# Patient Record
Sex: Female | Born: 1975 | Race: White | Hispanic: No | Marital: Married | Smoking: Former smoker
Health system: Southern US, Community
[De-identification: ages and names within clinical notes are randomized; demographics above are authoritative.]

---

## 2009-09-06 ENCOUNTER — Encounter: Admission: RE | Admit: 2009-09-06 | Discharge: 2009-09-06 | Payer: Self-pay | Admitting: Obstetrics & Gynecology

## 2009-09-19 ENCOUNTER — Encounter: Admission: RE | Admit: 2009-09-19 | Discharge: 2009-09-19 | Payer: Self-pay | Admitting: Obstetrics & Gynecology

## 2010-09-18 ENCOUNTER — Other Ambulatory Visit: Payer: Self-pay | Admitting: Obstetrics & Gynecology

## 2010-09-18 DIAGNOSIS — Z1231 Encounter for screening mammogram for malignant neoplasm of breast: Secondary | ICD-10-CM

## 2010-10-06 ENCOUNTER — Ambulatory Visit
Admission: RE | Admit: 2010-10-06 | Discharge: 2010-10-06 | Disposition: A | Discharge status: Home / Self Care | Payer: 59 | Source: Ambulatory Visit | Attending: Obstetrics & Gynecology | Admitting: Obstetrics & Gynecology

## 2010-10-06 DIAGNOSIS — Z1231 Encounter for screening mammogram for malignant neoplasm of breast: Secondary | ICD-10-CM

## 2013-08-10 ENCOUNTER — Other Ambulatory Visit: Payer: Self-pay

## 2013-08-12 ENCOUNTER — Other Ambulatory Visit: Payer: Self-pay

## 2013-08-12 DIAGNOSIS — Z1231 Encounter for screening mammogram for malignant neoplasm of breast: Secondary | ICD-10-CM

## 2013-11-16 ENCOUNTER — Ambulatory Visit
Admission: RE | Admit: 2013-11-16 | Discharge: 2013-11-16 | Disposition: A | Discharge status: Home / Self Care | Payer: 59 | Source: Ambulatory Visit

## 2013-11-16 DIAGNOSIS — Z1231 Encounter for screening mammogram for malignant neoplasm of breast: Secondary | ICD-10-CM

## 2014-11-11 ENCOUNTER — Ambulatory Visit (INDEPENDENT_AMBULATORY_CARE_PROVIDER_SITE_OTHER): Payer: Commercial Managed Care - HMO

## 2014-11-11 DIAGNOSIS — J309 Allergic rhinitis, unspecified: Secondary | ICD-10-CM

## 2014-12-10 ENCOUNTER — Ambulatory Visit (INDEPENDENT_AMBULATORY_CARE_PROVIDER_SITE_OTHER): Payer: Commercial Managed Care - HMO

## 2014-12-10 ENCOUNTER — Other Ambulatory Visit: Payer: Self-pay

## 2014-12-10 DIAGNOSIS — J309 Allergic rhinitis, unspecified: Secondary | ICD-10-CM | POA: Diagnosis not present

## 2014-12-10 MED ORDER — EPINEPHRINE 0.3 MG/0.3ML IJ SOAJ: INTRAMUSCULAR | Status: AC

## 2014-12-10 MED ORDER — EPINEPHRINE 0.3 MG/0.3ML IJ SOAJ: INTRAMUSCULAR | Status: DC

## 2015-01-06 ENCOUNTER — Ambulatory Visit (INDEPENDENT_AMBULATORY_CARE_PROVIDER_SITE_OTHER): Payer: Commercial Managed Care - HMO

## 2015-01-06 DIAGNOSIS — J309 Allergic rhinitis, unspecified: Secondary | ICD-10-CM | POA: Diagnosis not present

## 2016-02-20 NOTE — Addendum Note (Signed)
Addended by: Berna BueWHITAKER, CARRIE L on: 02/20/2016 11:13 AM   Modules accepted: Orders

## 2017-03-08 ENCOUNTER — Other Ambulatory Visit: Payer: Self-pay | Admitting: Obstetrics & Gynecology

## 2017-03-08 DIAGNOSIS — Z1231 Encounter for screening mammogram for malignant neoplasm of breast: Secondary | ICD-10-CM

## 2017-04-24 ENCOUNTER — Encounter: Payer: Commercial Managed Care - HMO | Admitting: Obstetrics & Gynecology

## 2017-05-03 ENCOUNTER — Encounter: Payer: Self-pay | Admitting: Obstetrics & Gynecology

## 2017-05-03 ENCOUNTER — Ambulatory Visit (INDEPENDENT_AMBULATORY_CARE_PROVIDER_SITE_OTHER): Payer: 59 | Admitting: Obstetrics & Gynecology

## 2017-05-03 VITALS — BP 116/74 | Ht 64.0 in | Wt 124.0 lb

## 2017-05-03 DIAGNOSIS — Z01419 Encounter for gynecological examination (general) (routine) without abnormal findings: Secondary | ICD-10-CM

## 2017-05-03 DIAGNOSIS — Z3041 Encounter for surveillance of contraceptive pills: Secondary | ICD-10-CM | POA: Diagnosis not present

## 2017-05-03 MED ORDER — NORETHIN ACE-ETH ESTRAD-FE 1-20 MG-MCG PO TABS: 1.0000 | ORAL_TABLET | Freq: Every day | ORAL | 4 refills | Status: DC

## 2017-05-03 NOTE — Progress Notes (Signed)
Martha Lewis Jun 09, 1975 [id number]   History:    42 y.o. G0 Married  RP:  Established patient presenting for annual gyn exam   HPI: Well on Junel Fe 1/20.  No pelvic pain.  Normal vaginal secretions.  No pain with intercourse.  Urine and bowel movements normal.  Breasts normal.  BMI 21.28.  Very fit, doing [employer], wt lifting and Aerobic.  Strong Fam h/o CardioVascular disease.  Mother with Breast Ca. Screening mammogram scheduled for next week.  Health labs with Dr. Timothy Lasso.  Past medical history,surgical history, family history and social history were all reviewed and documented in the EPIC chart.  Gynecologic History Patient's last menstrual period was 11/03/2015 (lmp unknown). Contraception: OCP (estrogen/progesterone) Last Pap: 02/2016. Results were: Negative/HPV HR negative Last mammogram: 02/2016. Results were: negative.  Scheduled for Screening Mammo next week at [facility]. Bone Density: Never Colonoscopy: Never  Obstetric History OB History  Gravida Para Term Preterm AB Living  0 0 0 0 0 0  SAB TAB Ectopic Multiple Live Births  0 0 0 0 0     ROS: A ROS was performed and pertinent positives and negatives are included in the history.  GENERAL: No fevers or chills. HEENT: No change in vision, no earache, sore throat or sinus congestion. NECK: No pain or stiffness. CARDIOVASCULAR: No chest pain or pressure. No palpitations. PULMONARY: No shortness of breath, cough or wheeze. GASTROINTESTINAL: No abdominal pain, nausea, vomiting or diarrhea, melena or bright red blood per rectum. GENITOURINARY: No urinary frequency, urgency, hesitancy or dysuria. MUSCULOSKELETAL: No joint or muscle pain, no back pain, no recent trauma. DERMATOLOGIC: No rash, no itching, no lesions. ENDOCRINE: No polyuria, polydipsia, no heat or cold intolerance. No recent change in weight. HEMATOLOGICAL: No anemia or easy bruising or bleeding. NEUROLOGIC: No headache, seizures, numbness, tingling or  weakness. PSYCHIATRIC: No depression, no loss of interest in normal activity or change in sleep pattern.     Exam:   BP 116/74   Ht 5\' 4"  (1.626 m)   Wt 124 lb (56.2 kg)   LMP 11/03/2015 (LMP Unknown)   BMI 21.28 kg/m   Body mass index is 21.28 kg/m.  General appearance : Well developed well nourished female. No acute distress HEENT: Eyes: no retinal hemorrhage or exudates,  Neck supple, trachea midline, no carotid bruits, no thyroidmegaly Lungs: Clear to auscultation, no rhonchi or wheezes, or rib retractions  Heart: Regular rate and rhythm, no murmurs or gallops Breast:Examined in sitting and supine position were symmetrical in appearance, no palpable masses or tenderness,  no skin retraction, no nipple inversion, no nipple discharge, no skin discoloration, no axillary or supraclavicular lymphadenopathy Abdomen: no palpable masses or tenderness, no rebound or guarding Extremities: no edema or skin discoloration or tenderness  Pelvic: Vulva: Normal             Vagina: No gross lesions or discharge  Cervix: No gross lesions or discharge  Uterus  AV, normal size, shape and consistency, non-tender and mobile  Adnexa  Without masses or tenderness  Anus: Normal   Assessment/Plan:  42 y.o. female for annual exam   1. Well female exam with routine gynecological exam Normal gynecologic exam.  Last Pap test negative with negative high risk HPV in February 2018.  Will repeat Pap test next year.  Breast exam normal.  Screening mammogram next week.  Health labs with Dr. Timothy Lasso.  2. Encounter for surveillance of contraceptive pills Well on Junel FE 1/20.  No  contraindication to birth control pill.  Same birth control pill represcribed.  Other orders - norethindrone-ethinyl estradiol (JUNEL FE,GILDESS FE,LOESTRIN FE) 1-20 MG-MCG tablet; Take 1 tablet by mouth daily.  Genia DelMarie-Lyne Abagael Kramm MD, 3:52 PM 05/03/2017

## 2017-05-05 ENCOUNTER — Encounter: Payer: Self-pay | Admitting: Obstetrics & Gynecology

## 2017-05-05 NOTE — Patient Instructions (Signed)
1. Well female exam with routine gynecological exam Normal gynecologic exam.  Last Pap test negative with negative high risk HPV in February 2018.  Will repeat Pap test next year.  Breast exam normal.  Screening mammogram next week.  Health labs with Dr. Virgina Jock.  2. Encounter for surveillance of contraceptive pills Well on Junel FE 1/20.  No contraindication to birth control pill.  Same birth control pill represcribed.  Other orders - norethindrone-ethinyl estradiol (JUNEL FE,GILDESS FE,LOESTRIN FE) 1-20 MG-MCG tablet; Take 1 tablet by mouth daily.  Martha Lewis, it was a pleasure seeing you today!  Health Maintenance, Female Adopting a healthy lifestyle and getting preventive care can go a long way to promote health and wellness. Talk with your health care provider about what schedule of regular examinations is right for you. This is a good chance for you to check in with your provider about disease prevention and staying healthy. In between checkups, there are plenty of things you can do on your own. Experts have done a lot of research about which lifestyle changes and preventive measures are most likely to keep you healthy. Ask your health care provider for more information. Weight and diet Eat a healthy diet  Be sure to include plenty of vegetables, fruits, low-fat dairy products, and lean protein.  Do not eat a lot of foods high in solid fats, added sugars, or salt.  Get regular exercise. This is one of the most important things you can do for your health. ? Most adults should exercise for at least 150 minutes each week. The exercise should increase your heart rate and make you sweat (moderate-intensity exercise). ? Most adults should also do strengthening exercises at least twice a week. This is in addition to the moderate-intensity exercise.  Maintain a healthy weight  Body mass index (BMI) is a measurement that can be used to identify possible weight problems. It estimates body fat based  on height and weight. Your health care provider can help determine your BMI and help you achieve or maintain a healthy weight.  For females 44 years of age and older: ? A BMI below 18.5 is considered underweight. ? A BMI of 18.5 to 24.9 is normal. ? A BMI of 25 to 29.9 is considered overweight. ? A BMI of 30 and above is considered obese.  Watch levels of cholesterol and blood lipids  You should start having your blood tested for lipids and cholesterol at 45 years of age, then have this test every 5 years.  You may need to have your cholesterol levels checked more often if: ? Your lipid or cholesterol levels are high. ? You are older than 44 years of age. ? You are at high risk for heart disease.  Cancer screening Lung Cancer  Lung cancer screening is recommended for adults 58-46 years old who are at high risk for lung cancer because of a history of smoking.  A yearly low-dose CT scan of the lungs is recommended for people who: ? Currently smoke. ? Have quit within the past 15 years. ? Have at least a 30-pack-year history of smoking. A pack year is smoking an average of one pack of cigarettes a day for 1 year.  Yearly screening should continue until it has been 15 years since you quit.  Yearly screening should stop if you develop a health problem that would prevent you from having lung cancer treatment.  Breast Cancer  Practice breast self-awareness. This means understanding how your breasts normally appear and  feel.  It also means doing regular breast self-exams. Let your health care provider know about any changes, no matter how small.  If you are in your 20s or 30s, you should have a clinical breast exam (CBE) by a health care provider every 1-3 years as part of a regular health exam.  If you are 71 or older, have a CBE every year. Also consider having a breast X-ray (mammogram) every year.  If you have a family history of breast cancer, talk to your health care provider  about genetic screening.  If you are at high risk for breast cancer, talk to your health care provider about having an MRI and a mammogram every year.  Breast cancer gene (BRCA) assessment is recommended for women who have family members with BRCA-related cancers. BRCA-related cancers include: ? Breast. ? Ovarian. ? Tubal. ? Peritoneal cancers.  Results of the assessment will determine the need for genetic counseling and BRCA1 and BRCA2 testing.  Cervical Cancer Your health care provider may recommend that you be screened regularly for cancer of the pelvic organs (ovaries, uterus, and vagina). This screening involves a pelvic examination, including checking for microscopic changes to the surface of your cervix (Pap test). You may be encouraged to have this screening done every 3 years, beginning at age 93.  For women ages 27-65, health care providers may recommend pelvic exams and Pap testing every 3 years, or they may recommend the Pap and pelvic exam, combined with testing for human papilloma virus (HPV), every 5 years. Some types of HPV increase your risk of cervical cancer. Testing for HPV may also be done on women of any age with unclear Pap test results.  Other health care providers may not recommend any screening for nonpregnant women who are considered low risk for pelvic cancer and who do not have symptoms. Ask your health care provider if a screening pelvic exam is right for you.  If you have had past treatment for cervical cancer or a condition that could lead to cancer, you need Pap tests and screening for cancer for at least 20 years after your treatment. If Pap tests have been discontinued, your risk factors (such as having a new sexual partner) need to be reassessed to determine if screening should resume. Some women have medical problems that increase the chance of getting cervical cancer. In these cases, your health care provider may recommend more frequent screening and Pap  tests.  Colorectal Cancer  This type of cancer can be detected and often prevented.  Routine colorectal cancer screening usually begins at 44 years of age and continues through 44 years of age.  Your health care provider may recommend screening at an earlier age if you have risk factors for colon cancer.  Your health care provider may also recommend using home test kits to check for hidden blood in the stool.  A small camera at the end of a tube can be used to examine your colon directly (sigmoidoscopy or colonoscopy). This is done to check for the earliest forms of colorectal cancer.  Routine screening usually begins at age 10.  Direct examination of the colon should be repeated every 5-10 years through 44 years of age. However, you may need to be screened more often if early forms of precancerous polyps or small growths are found.  Skin Cancer  Check your skin from head to toe regularly.  Tell your health care provider about any new moles or changes in moles, especially if there  is a change in a mole's shape or color.  Also tell your health care provider if you have a mole that is larger than the size of a pencil eraser.  Always use sunscreen. Apply sunscreen liberally and repeatedly throughout the day.  Protect yourself by wearing long sleeves, pants, a wide-brimmed hat, and sunglasses whenever you are outside.  Heart disease, diabetes, and high blood pressure  High blood pressure causes heart disease and increases the risk of stroke. High blood pressure is more likely to develop in: ? People who have blood pressure in the high end of the normal range (130-139/85-89 mm Hg). ? People who are overweight or obese. ? People who are African American.  If you are 52-32 years of age, have your blood pressure checked every 3-5 years. If you are 91 years of age or older, have your blood pressure checked every year. You should have your blood pressure measured twice-once when you are at  a hospital or clinic, and once when you are not at a hospital or clinic. Record the average of the two measurements. To check your blood pressure when you are not at a hospital or clinic, you can use: ? An automated blood pressure machine at a pharmacy. ? A home blood pressure monitor.  If you are between 59 years and 39 years old, ask your health care provider if you should take aspirin to prevent strokes.  Have regular diabetes screenings. This involves taking a blood sample to check your fasting blood sugar level. ? If you are at a normal weight and have a low risk for diabetes, have this test once every three years after 44 years of age. ? If you are overweight and have a high risk for diabetes, consider being tested at a younger age or more often. Preventing infection Hepatitis B  If you have a higher risk for hepatitis B, you should be screened for this virus. You are considered at high risk for hepatitis B if: ? You were born in a country where hepatitis B is common. Ask your health care provider which countries are considered high risk. ? Your parents were born in a high-risk country, and you have not been immunized against hepatitis B (hepatitis B vaccine). ? You have HIV or AIDS. ? You use needles to inject street drugs. ? You live with someone who has hepatitis B. ? You have had sex with someone who has hepatitis B. ? You get hemodialysis treatment. ? You take certain medicines for conditions, including cancer, organ transplantation, and autoimmune conditions.  Hepatitis C  Blood testing is recommended for: ? Everyone born from [birth year] through 1967. ? Anyone with known risk factors for hepatitis C.  Sexually transmitted infections (STIs)  You should be screened for sexually transmitted infections (STIs) including gonorrhea and chlamydia if: ? You are sexually active and are younger than 44 years of age. ? You are older than 44 years of age and your health care provider tells  you that you are at risk for this type of infection. ? Your sexual activity has changed since you were last screened and you are at an increased risk for chlamydia or gonorrhea. Ask your health care provider if you are at risk.  If you do not have HIV, but are at risk, it may be recommended that you take a prescription medicine daily to prevent HIV infection. This is called pre-exposure prophylaxis (PrEP). You are considered at risk if: ? You are sexually active and do  not regularly use condoms or know the HIV status of your partner(s). ? You take drugs by injection. ? You are sexually active with a partner who has HIV.  Talk with your health care provider about whether you are at high risk of being infected with HIV. If you choose to begin PrEP, you should first be tested for HIV. You should then be tested every 3 months for as long as you are taking PrEP. Pregnancy  If you are premenopausal and you may become pregnant, ask your health care provider about preconception counseling.  If you may become pregnant, take 400 to 800 micrograms (mcg) of folic acid every day.  If you want to prevent pregnancy, talk to your health care provider about birth control (contraception). Osteoporosis and menopause  Osteoporosis is a disease in which the bones lose minerals and strength with aging. This can result in serious bone fractures. Your risk for osteoporosis can be identified using a bone density scan.  If you are 1 years of age or older, or if you are at risk for osteoporosis and fractures, ask your health care provider if you should be screened.  Ask your health care provider whether you should take a calcium or vitamin D supplement to lower your risk for osteoporosis.  Menopause may have certain physical symptoms and risks.  Hormone replacement therapy may reduce some of these symptoms and risks. Talk to your health care provider about whether hormone replacement therapy is right for  you. Follow these instructions at home:  Schedule regular health, dental, and eye exams.  Stay current with your immunizations.  Do not use any tobacco products including cigarettes, chewing tobacco, or electronic cigarettes.  If you are pregnant, do not drink alcohol.  If you are breastfeeding, limit how much and how often you drink alcohol.  Limit alcohol intake to no more than 1 drink per day for nonpregnant women. One drink equals 12 ounces of beer, 5 ounces of wine, or 1 ounces of hard liquor.  Do not use street drugs.  Do not share needles.  Ask your health care provider for help if you need support or information about quitting drugs.  Tell your health care provider if you often feel depressed.  Tell your health care provider if you have ever been abused or do not feel safe at home. This information is not intended to replace advice given to you by your health care provider. Make sure you discuss any questions you have with your health care provider. Document Released: 07/24/2010 Document Revised: 06/16/2015 Document Reviewed: 10/12/2014 Elsevier Interactive Patient Education  [employer].

## 2017-05-07 ENCOUNTER — Ambulatory Visit
Admission: RE | Admit: 2017-05-07 | Discharge: 2017-05-07 | Disposition: A | Discharge status: Home / Self Care | Payer: 59 | Source: Ambulatory Visit | Attending: Obstetrics & Gynecology | Admitting: Obstetrics & Gynecology

## 2017-05-07 DIAGNOSIS — Z1231 Encounter for screening mammogram for malignant neoplasm of breast: Secondary | ICD-10-CM

## 2018-03-21 ENCOUNTER — Other Ambulatory Visit: Payer: Self-pay | Admitting: Obstetrics & Gynecology

## 2018-03-21 DIAGNOSIS — Z1231 Encounter for screening mammogram for malignant neoplasm of breast: Secondary | ICD-10-CM

## 2018-05-12 ENCOUNTER — Ambulatory Visit: Payer: 59

## 2018-06-11 ENCOUNTER — Other Ambulatory Visit: Payer: Self-pay | Admitting: Obstetrics & Gynecology

## 2018-07-15 ENCOUNTER — Encounter: Payer: 59 | Admitting: Obstetrics & Gynecology

## 2018-07-21 ENCOUNTER — Ambulatory Visit
Admission: RE | Admit: 2018-07-21 | Discharge: 2018-07-21 | Disposition: A | Discharge status: Home / Self Care | Payer: 59 | Source: Ambulatory Visit | Attending: Obstetrics & Gynecology | Admitting: Obstetrics & Gynecology

## 2018-07-21 ENCOUNTER — Other Ambulatory Visit: Payer: Self-pay

## 2018-07-21 DIAGNOSIS — Z1231 Encounter for screening mammogram for malignant neoplasm of breast: Secondary | ICD-10-CM

## 2018-08-14 ENCOUNTER — Other Ambulatory Visit: Payer: Self-pay

## 2018-08-15 ENCOUNTER — Ambulatory Visit (INDEPENDENT_AMBULATORY_CARE_PROVIDER_SITE_OTHER): Payer: 59 | Admitting: Obstetrics & Gynecology

## 2018-08-15 ENCOUNTER — Encounter: Payer: Self-pay | Admitting: Obstetrics & Gynecology

## 2018-08-15 VITALS — BP 120/78 | Ht 62.25 in | Wt 127.0 lb

## 2018-08-15 DIAGNOSIS — Z01419 Encounter for gynecological examination (general) (routine) without abnormal findings: Secondary | ICD-10-CM | POA: Diagnosis not present

## 2018-08-15 DIAGNOSIS — Z3041 Encounter for surveillance of contraceptive pills: Secondary | ICD-10-CM

## 2018-08-15 MED ORDER — NORETHIN ACE-ETH ESTRAD-FE 1-20 MG-MCG PO TABS: 1.0000 | ORAL_TABLET | Freq: Every day | ORAL | 4 refills | Status: DC

## 2018-08-15 NOTE — Patient Instructions (Signed)
1. Encounter for routine gynecological examination with Papanicolaou smear of cervix Normal gynecologic exam.  Pap reflex done.  Breast exam normal.  Screening mammogram June 2020 was negative.  Health labs with Dr. Virgina Jock.  Good body mass index at 23.04.  Continue with fitness and healthy nutrition.  2. Encounter for surveillance of contraceptive pills Well on birth control pills with Blisovi.  No contraindication to continue on birth control pills.  Prescription sent to pharmacy.  Other orders - norethindrone-ethinyl estradiol (BLISOVI FE 1/20) 1-20 MG-MCG tablet; Take 1 tablet by mouth daily.  [name], it was a pleasure seeing you today!  I will inform you of your results as soon as they are available.

## 2018-08-15 NOTE — Progress Notes (Signed)
Martha Lewis January 09, 1976 [id number]   History:    43 y.o. G0 Married.  Works from home.  RP:  Established patient presenting for annual gyn exam   HPI: Well on Blisovi.  No breakthrough bleeding.  No pelvic pain.  No pain with intercourse.  Urine and bowel movements normal.  Breasts normal.  Mother with breast cancer in her 2460s.  Body mass index 23.04.  Good fitness.  Healthy nutrition. Health labs with Dr. Timothy Lassousso.   Past medical history,surgical history, family history and social history were all reviewed and documented in the EPIC chart.  Gynecologic History Patient's last menstrual period was 11/03/2015 (lmp unknown). Contraception: OCP (estrogen/progesterone) Last Pap: 02/2016. Results were: Negative Last mammogram: 06/2018. Results were: Negative Bone Density: Never Colonoscopy: Never  Obstetric History OB History  Gravida Para Term Preterm AB Living  0 0 0 0 0 0  SAB TAB Ectopic Multiple Live Births  0 0 0 0 0     ROS: A ROS was performed and pertinent positives and negatives are included in the history.  GENERAL: No fevers or chills. HEENT: No change in vision, no earache, sore throat or sinus congestion. NECK: No pain or stiffness. CARDIOVASCULAR: No chest pain or pressure. No palpitations. PULMONARY: No shortness of breath, cough or wheeze. GASTROINTESTINAL: No abdominal pain, nausea, vomiting or diarrhea, melena or bright red blood per rectum. GENITOURINARY: No urinary frequency, urgency, hesitancy or dysuria. MUSCULOSKELETAL: No joint or muscle pain, no back pain, no recent trauma. DERMATOLOGIC: No rash, no itching, no lesions. ENDOCRINE: No polyuria, polydipsia, no heat or cold intolerance. No recent change in weight. HEMATOLOGICAL: No anemia or easy bruising or bleeding. NEUROLOGIC: No headache, seizures, numbness, tingling or weakness. PSYCHIATRIC: No depression, no loss of interest in normal activity or change in sleep pattern.     Exam:   BP 120/78   Ht 5'  2.25" (1.581 m)   Wt 127 lb (57.6 kg)   LMP 11/03/2015 (LMP Unknown)   BMI 23.04 kg/m   Body mass index is 23.04 kg/m.  General appearance : Well developed well nourished female. No acute distress HEENT: Eyes: no retinal hemorrhage or exudates,  Neck supple, trachea midline, no carotid bruits, no thyroidmegaly Lungs: Clear to auscultation, no rhonchi or wheezes, or rib retractions  Heart: Regular rate and rhythm, no murmurs or gallops Breast:Examined in sitting and supine position were symmetrical in appearance, no palpable masses or tenderness,  no skin retraction, no nipple inversion, no nipple discharge, no skin discoloration, no axillary or supraclavicular lymphadenopathy Abdomen: no palpable masses or tenderness, no rebound or guarding Extremities: no edema or skin discoloration or tenderness  Pelvic: Vulva: Normal             Vagina: No gross lesions or discharge  Cervix: No gross lesions or discharge.  Pap reflex done.  Uterus  AV, normal size, shape and consistency, non-tender and mobile  Adnexa  Without masses or tenderness  Anus: Normal   Assessment/Plan:  43 y.o. female for annual exam   1. Encounter for routine gynecological examination with Papanicolaou smear of cervix Normal gynecologic exam.  Pap reflex done.  Breast exam normal.  Screening mammogram June 2020 was negative.  Health labs with Dr. Timothy Lassousso.  Good body mass index at 23.04.  Continue with fitness and healthy nutrition.  2. Encounter for surveillance of contraceptive pills Well on birth control pills with Blisovi.  No contraindication to continue on birth control pills.  Prescription sent to pharmacy.  Other orders - norethindrone-ethinyl estradiol (BLISOVI FE 1/20) 1-20 MG-MCG tablet; Take 1 tablet by mouth daily.  Princess Bruins MD, 2:57 PM 08/15/2018

## 2018-08-15 NOTE — Addendum Note (Signed)
Addended by: CASTILLO, BLANCA A on: 08/15/2018 03:47 PM   Modules accepted: Orders  

## 2018-08-18 LAB — PAP IG W/ RFLX HPV ASCU

## 2019-07-16 ENCOUNTER — Other Ambulatory Visit: Payer: Self-pay | Admitting: Obstetrics & Gynecology

## 2019-07-16 DIAGNOSIS — Z1231 Encounter for screening mammogram for malignant neoplasm of breast: Secondary | ICD-10-CM

## 2019-08-12 ENCOUNTER — Ambulatory Visit
Admission: RE | Admit: 2019-08-12 | Discharge: 2019-08-12 | Disposition: A | Discharge status: Home / Self Care | Payer: 59 | Source: Ambulatory Visit | Attending: Obstetrics & Gynecology | Admitting: Obstetrics & Gynecology

## 2019-08-12 ENCOUNTER — Other Ambulatory Visit: Payer: Self-pay

## 2019-08-12 DIAGNOSIS — Z1231 Encounter for screening mammogram for malignant neoplasm of breast: Secondary | ICD-10-CM

## 2019-09-07 ENCOUNTER — Ambulatory Visit (INDEPENDENT_AMBULATORY_CARE_PROVIDER_SITE_OTHER): Payer: 59 | Admitting: Obstetrics & Gynecology

## 2019-09-07 ENCOUNTER — Other Ambulatory Visit: Payer: Self-pay

## 2019-09-07 ENCOUNTER — Encounter: Payer: Self-pay | Admitting: Obstetrics & Gynecology

## 2019-09-07 VITALS — BP 108/70 | Ht 62.5 in | Wt 123.0 lb

## 2019-09-07 DIAGNOSIS — Z01419 Encounter for gynecological examination (general) (routine) without abnormal findings: Secondary | ICD-10-CM

## 2019-09-07 DIAGNOSIS — Z3041 Encounter for surveillance of contraceptive pills: Secondary | ICD-10-CM

## 2019-09-07 MED ORDER — NORETHIN ACE-ETH ESTRAD-FE 1-20 MG-MCG PO TABS: 1.0000 | ORAL_TABLET | Freq: Every day | ORAL | 4 refills | Status: DC

## 2019-09-07 NOTE — Progress Notes (Signed)
Martha Lewis 27-Nov-1975 [id number]   History:    44 y.o. G0 Married.  Works from home.  RP:  Established patient presenting for annual gyn exam   HPI: Well on Blisovi.  No breakthrough bleeding.  No pelvic pain.  No pain with intercourse.  Urine and bowel movements normal.  Breasts normal.  Mother with breast cancer in her 24s.  Body mass index 22.14.  Good fitness with [employer].  Healthy nutrition. Health labs with Dr. Timothy Lasso.  Past medical history,surgical history, family history and social history were all reviewed and documented in the EPIC chart.  Gynecologic History Patient's last menstrual period was 11/03/2015 (lmp unknown).  Obstetric History OB History  Gravida Para Term Preterm AB Living  0 0 0 0 0 0  SAB TAB Ectopic Multiple Live Births  0 0 0 0 0     ROS: A ROS was performed and pertinent positives and negatives are included in the history.  GENERAL: No fevers or chills. HEENT: No change in vision, no earache, sore throat or sinus congestion. NECK: No pain or stiffness. CARDIOVASCULAR: No chest pain or pressure. No palpitations. PULMONARY: No shortness of breath, cough or wheeze. GASTROINTESTINAL: No abdominal pain, nausea, vomiting or diarrhea, melena or bright red blood per rectum. GENITOURINARY: No urinary frequency, urgency, hesitancy or dysuria. MUSCULOSKELETAL: No joint or muscle pain, no back pain, no recent trauma. DERMATOLOGIC: No rash, no itching, no lesions. ENDOCRINE: No polyuria, polydipsia, no heat or cold intolerance. No recent change in weight. HEMATOLOGICAL: No anemia or easy bruising or bleeding. NEUROLOGIC: No headache, seizures, numbness, tingling or weakness. PSYCHIATRIC: No depression, no loss of interest in normal activity or change in sleep pattern.     Exam:   BP 108/70   Ht 5' 2.5" (1.588 m)   Wt 123 lb (55.8 kg)   LMP 11/03/2015 (LMP Unknown)   BMI 22.14 kg/m   Body mass index is 22.14 kg/m.  General appearance : Well  developed well nourished female. No acute distress HEENT: Eyes: no retinal hemorrhage or exudates,  Neck supple, trachea midline, no carotid bruits, no thyroidmegaly Lungs: Clear to auscultation, no rhonchi or wheezes, or rib retractions  Heart: Regular rate and rhythm, no murmurs or gallops Breast:Examined in sitting and supine position were symmetrical in appearance, no palpable masses or tenderness,  no skin retraction, no nipple inversion, no nipple discharge, no skin discoloration, no axillary or supraclavicular lymphadenopathy Abdomen: no palpable masses or tenderness, no rebound or guarding Extremities: no edema or skin discoloration or tenderness  Pelvic: Vulva: Normal             Vagina: No gross lesions or discharge  Cervix: No gross lesions or discharge  Uterus  AV, normal size, shape and consistency, non-tender and mobile  Adnexa  Without masses or tenderness  Anus: Normal   Assessment/Plan:  44 y.o. female for annual exam   1. Well female exam with routine gynecological exam Normal gynecologic exam.  No indication to perform a Pap test this year.  Breast exam normal.  Screening mammogram July 2021 was negative.  Good body mass index at 22.14.  Continue with fitness and healthy nutrition.  Health labs with Dr. Timothy Lasso.  2. Encounter for surveillance of contraceptive pills Well on Blisovi FE 1/20.  No contraindication to continue.  Prescription sent to pharmacy.  Other orders - norethindrone-ethinyl estradiol (BLISOVI FE 1/20) 1-20 MG-MCG tablet; Take 1 tablet by mouth daily.  Genia Del MD, 4:36 PM 09/07/2019

## 2019-09-19 ENCOUNTER — Other Ambulatory Visit: Payer: Self-pay | Admitting: Obstetrics & Gynecology

## 2020-07-12 ENCOUNTER — Other Ambulatory Visit: Payer: Self-pay | Admitting: Obstetrics & Gynecology

## 2020-07-12 DIAGNOSIS — Z1231 Encounter for screening mammogram for malignant neoplasm of breast: Secondary | ICD-10-CM

## 2020-08-10 ENCOUNTER — Ambulatory Visit (INDEPENDENT_AMBULATORY_CARE_PROVIDER_SITE_OTHER): Payer: 59 | Admitting: Psychology

## 2020-08-10 DIAGNOSIS — F331 Major depressive disorder, recurrent, moderate: Secondary | ICD-10-CM | POA: Diagnosis not present

## 2020-08-10 DIAGNOSIS — F419 Anxiety disorder, unspecified: Secondary | ICD-10-CM | POA: Diagnosis not present

## 2020-08-11 ENCOUNTER — Ambulatory Visit: Payer: 59 | Admitting: Psychology

## 2020-09-06 ENCOUNTER — Ambulatory Visit
Admission: RE | Admit: 2020-09-06 | Discharge: 2020-09-06 | Disposition: A | Discharge status: Home / Self Care | Payer: 59 | Source: Ambulatory Visit | Attending: Obstetrics & Gynecology | Admitting: Obstetrics & Gynecology

## 2020-09-06 ENCOUNTER — Other Ambulatory Visit: Payer: Self-pay

## 2020-09-06 DIAGNOSIS — Z1231 Encounter for screening mammogram for malignant neoplasm of breast: Secondary | ICD-10-CM

## 2020-09-07 ENCOUNTER — Ambulatory Visit: Payer: 59 | Admitting: Psychology

## 2020-09-07 ENCOUNTER — Ambulatory Visit (INDEPENDENT_AMBULATORY_CARE_PROVIDER_SITE_OTHER): Payer: 59 | Admitting: Psychology

## 2020-09-07 DIAGNOSIS — F419 Anxiety disorder, unspecified: Secondary | ICD-10-CM | POA: Diagnosis not present

## 2020-09-07 DIAGNOSIS — F331 Major depressive disorder, recurrent, moderate: Secondary | ICD-10-CM

## 2020-09-28 ENCOUNTER — Ambulatory Visit: Payer: 59 | Admitting: Psychology

## 2020-10-05 ENCOUNTER — Ambulatory Visit (INDEPENDENT_AMBULATORY_CARE_PROVIDER_SITE_OTHER): Payer: 59 | Admitting: Psychology

## 2020-10-05 DIAGNOSIS — F419 Anxiety disorder, unspecified: Secondary | ICD-10-CM | POA: Diagnosis not present

## 2020-10-05 DIAGNOSIS — F331 Major depressive disorder, recurrent, moderate: Secondary | ICD-10-CM

## 2020-10-19 ENCOUNTER — Ambulatory Visit (INDEPENDENT_AMBULATORY_CARE_PROVIDER_SITE_OTHER): Payer: 59 | Admitting: Obstetrics & Gynecology

## 2020-10-19 ENCOUNTER — Encounter: Payer: Self-pay | Admitting: Obstetrics & Gynecology

## 2020-10-19 ENCOUNTER — Other Ambulatory Visit: Payer: Self-pay

## 2020-10-19 VITALS — BP 100/64 | HR 68 | Resp 16 | Ht 62.75 in | Wt 123.0 lb

## 2020-10-19 DIAGNOSIS — N951 Menopausal and female climacteric states: Secondary | ICD-10-CM

## 2020-10-19 DIAGNOSIS — Z3041 Encounter for surveillance of contraceptive pills: Secondary | ICD-10-CM | POA: Diagnosis not present

## 2020-10-19 DIAGNOSIS — Z01419 Encounter for gynecological examination (general) (routine) without abnormal findings: Secondary | ICD-10-CM

## 2020-10-19 MED ORDER — NORETHIN ACE-ETH ESTRAD-FE 1-20 MG-MCG PO TABS: 1.0000 | ORAL_TABLET | Freq: Every day | ORAL | 4 refills | Status: DC

## 2020-10-19 NOTE — Progress Notes (Signed)
Martha Lewis 1975/11/25 [id number]   History:    45 y.o. G0 Married.  Works from home.   RP:  Established patient presenting for annual gyn exam    HPI: Well on Blisovi. No menses.  No breakthrough bleeding.  No pelvic pain.  Occasional hot flushes and night sweats.  No pain with intercourse.  Urine and bowel movements normal.  Breasts normal.  Mother with breast cancer in her 53s.  Body mass index 21.96.  Good fitness with [employer].  Healthy nutrition. Health labs with Dr. Timothy Lasso.    Past medical history,surgical history, family history and social history were all reviewed and documented in the EPIC chart.  Gynecologic History Patient's last menstrual period was 11/03/2015 (lmp unknown).  Obstetric History OB History  Gravida Para Term Preterm AB Living  0 0 0 0 0 0  SAB IAB Ectopic Multiple Live Births  0 0 0 0 0     ROS: A ROS was performed and pertinent positives and negatives are included in the history.  GENERAL: No fevers or chills. HEENT: No change in vision, no earache, sore throat or sinus congestion. NECK: No pain or stiffness. CARDIOVASCULAR: No chest pain or pressure. No palpitations. PULMONARY: No shortness of breath, cough or wheeze. GASTROINTESTINAL: No abdominal pain, nausea, vomiting or diarrhea, melena or bright red blood per rectum. GENITOURINARY: No urinary frequency, urgency, hesitancy or dysuria. MUSCULOSKELETAL: No joint or muscle pain, no back pain, no recent trauma. DERMATOLOGIC: No rash, no itching, no lesions. ENDOCRINE: No polyuria, polydipsia, no heat or cold intolerance. No recent change in weight. HEMATOLOGICAL: No anemia or easy bruising or bleeding. NEUROLOGIC: No headache, seizures, numbness, tingling or weakness. PSYCHIATRIC: No depression, no loss of interest in normal activity or change in sleep pattern.     Exam:   BP 100/64   Pulse 68   Resp 16   Ht 5' 2.75" (1.594 m)   Wt 123 lb (55.8 kg)   LMP 11/03/2015 (LMP Unknown)   BMI  21.96 kg/m   Body mass index is 21.96 kg/m.  General appearance : Well developed well nourished female. No acute distress HEENT: Eyes: no retinal hemorrhage or exudates,  Neck supple, trachea midline, no carotid bruits, no thyroidmegaly Lungs: Clear to auscultation, no rhonchi or wheezes, or rib retractions  Heart: Regular rate and rhythm, no murmurs or gallops Breast:Examined in sitting and supine position were symmetrical in appearance, no palpable masses or tenderness,  no skin retraction, no nipple inversion, no nipple discharge, no skin discoloration, no axillary or supraclavicular lymphadenopathy Abdomen: no palpable masses or tenderness, no rebound or guarding Extremities: no edema or skin discoloration or tenderness  Pelvic: Vulva: Normal             Vagina: No gross lesions or discharge  Cervix: No gross lesions or discharge  Uterus  AV, normal size, shape and consistency, non-tender and mobile  Adnexa  Without masses or tenderness  Anus: Normal   Assessment/Plan:  45 y.o. female for annual exam   1. Well female exam with routine gynecological exam Normal gynecologic exam.  Pap test in 2020 was negative, will repeat at 3 years.  Breast exam normal.  Screening mammogram August 2022 was negative.  We will plan a first colonoscopy this year.  Health labs with Dr. Timothy Lasso.  Good body mass index at 21.96.  Continue with fitness and healthy nutrition.  2. Encounter for surveillance of contraceptive pills Well on birth control pills.  No contraindication  to continue.  Blisovi FE 1/20 represcribed.  3. Perimenopausal symptoms Occasional hot flushes and night sweats.  No withdrawal bleeding on the birth control pill.  Will come for an [facility] after the week off birth control pill to evaluate for perimenopause or menopausal status.  Patient explained that [facility] can be variable at the time of perimenopause and that by definition menopause is a clinical diagnosis after 1 year without menstrual  periods off of any contraception or hormonal treatment. - FSH  Other orders - clindamycin-benzoyl peroxide (BENZACLIN) gel; SMARTSIG:1 Topical Daily PRN - minocycline (MINOCIN) 100 MG capsule; Take by mouth daily. - Omega-3 Fatty Acids (FISH OIL PO); Take by mouth. Take 2 - MAGNESIUM PO; Take by mouth. - B Complex Vitamins (B COMPLEX PO); Take by mouth. + c - Calcium Citrate-Vitamin D (CALCIUM + D PO); Take by mouth. - Multiple Vitamins-Minerals (ZINC PO); Take by mouth. + copper - ASHWAGANDHA PO; Take by mouth. - norethindrone-ethinyl estradiol-FE (BLISOVI FE 1/20) 1-20 MG-MCG tablet; Take 1 tablet by mouth daily.   Genia Del MD, 3:50 PM 10/19/2020

## 2020-11-09 ENCOUNTER — Ambulatory Visit (INDEPENDENT_AMBULATORY_CARE_PROVIDER_SITE_OTHER): Payer: 59 | Admitting: Psychology

## 2020-11-09 DIAGNOSIS — F331 Major depressive disorder, recurrent, moderate: Secondary | ICD-10-CM | POA: Diagnosis not present

## 2020-11-09 DIAGNOSIS — F419 Anxiety disorder, unspecified: Secondary | ICD-10-CM | POA: Diagnosis not present

## 2020-12-02 ENCOUNTER — Ambulatory Visit (INDEPENDENT_AMBULATORY_CARE_PROVIDER_SITE_OTHER): Payer: 59 | Admitting: Psychology

## 2020-12-02 DIAGNOSIS — F419 Anxiety disorder, unspecified: Secondary | ICD-10-CM | POA: Diagnosis not present

## 2020-12-02 DIAGNOSIS — F331 Major depressive disorder, recurrent, moderate: Secondary | ICD-10-CM

## 2020-12-30 ENCOUNTER — Ambulatory Visit: Payer: 59 | Admitting: Psychology

## 2021-01-10 IMAGING — MG DIGITAL SCREENING BILATERAL MAMMOGRAM WITH TOMO AND CAD
8 series · 9 of 24 positions shown · non-contrast
Comparison: Previous exam(s).

CLINICAL DATA: Screening.

EXAM:
DIGITAL SCREENING BILATERAL MAMMOGRAM WITH TOMO AND CAD

[R CC synth-2D]
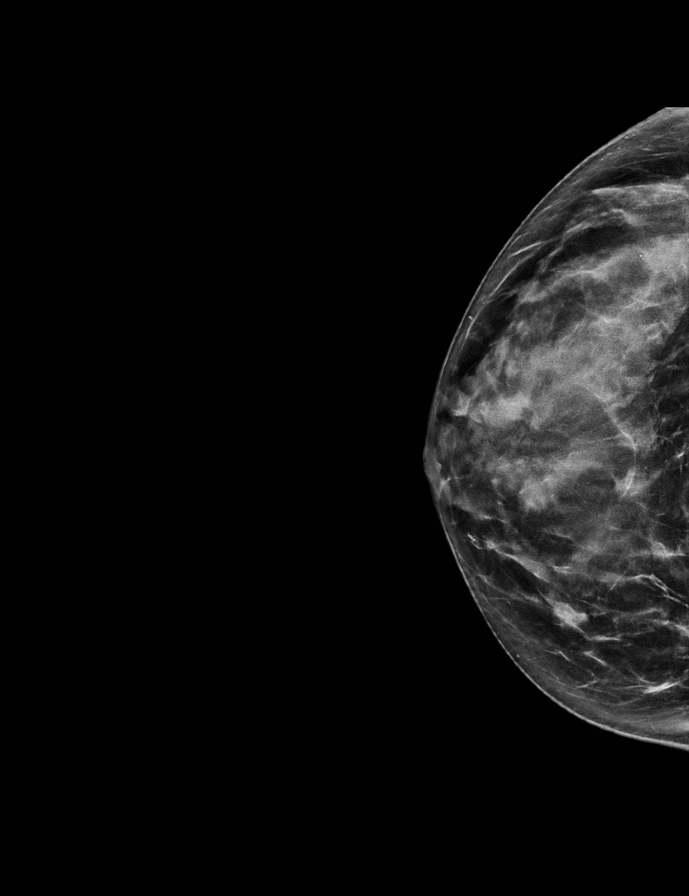

[L MLO synth-2D]
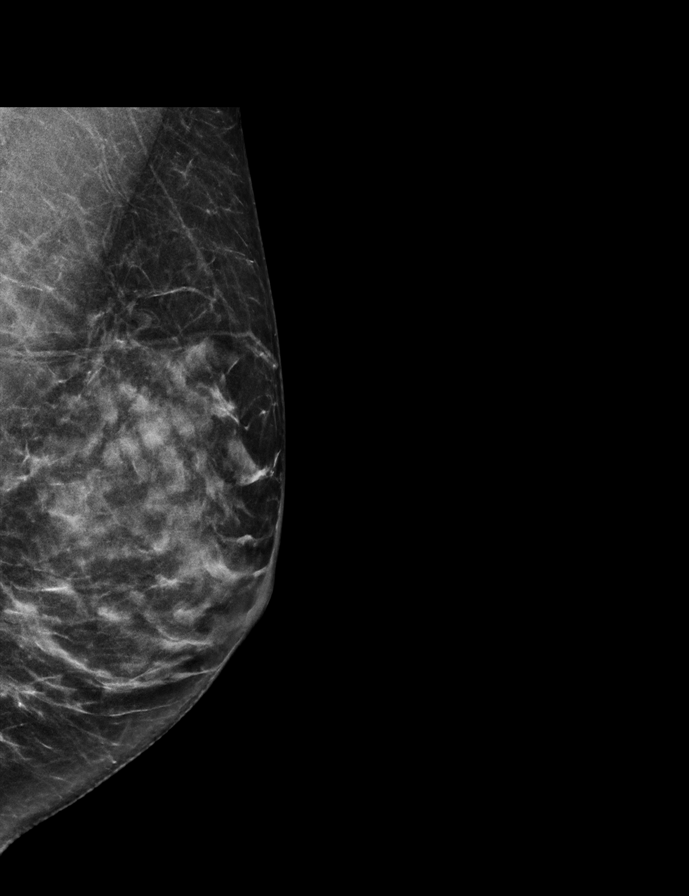

[L CC synth-2D]
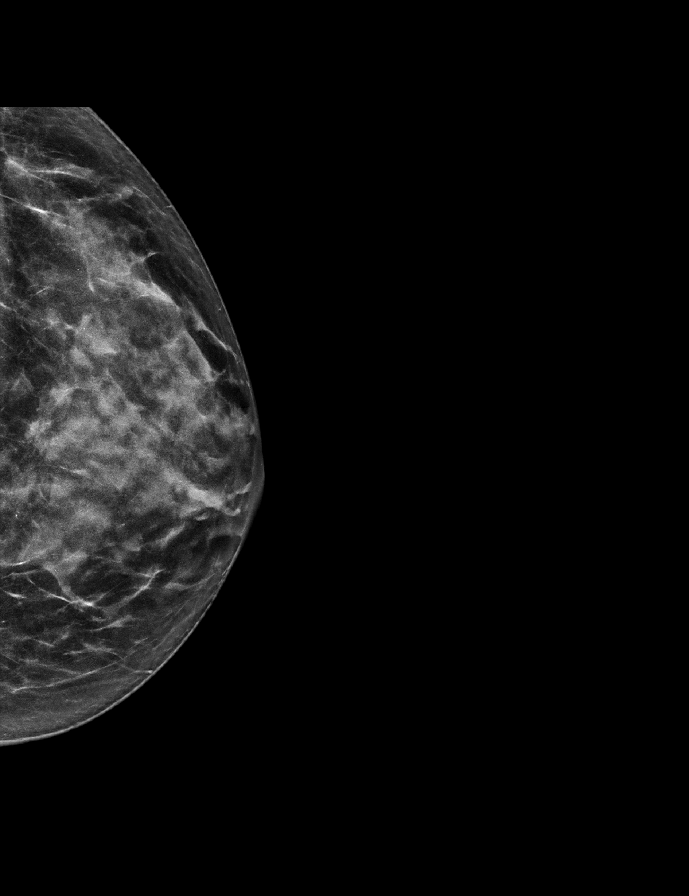

[R MLO synth-2D]
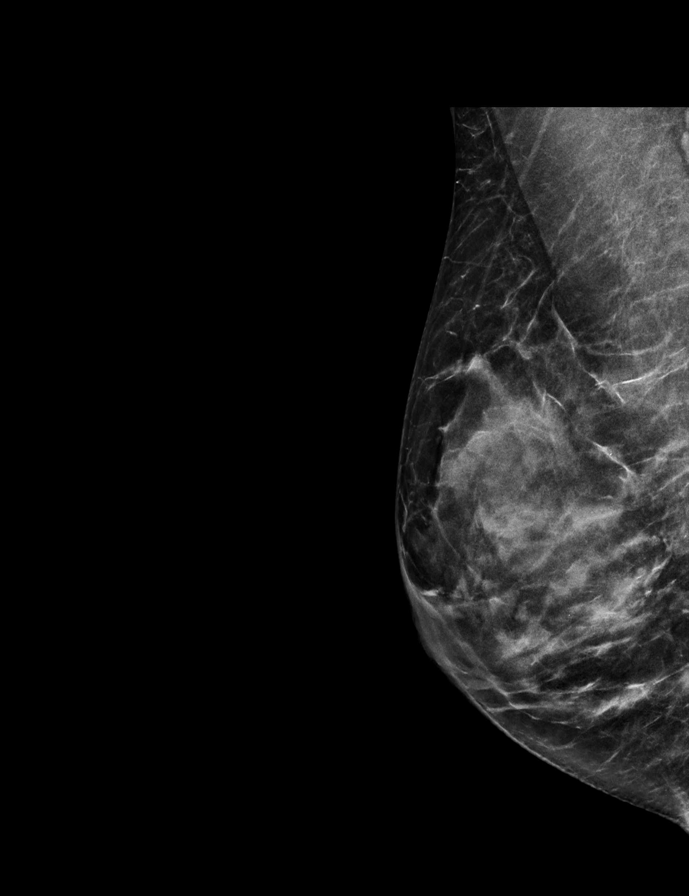

[L CC tomo · 2 of 56 frames shown]
[frame 19/56]
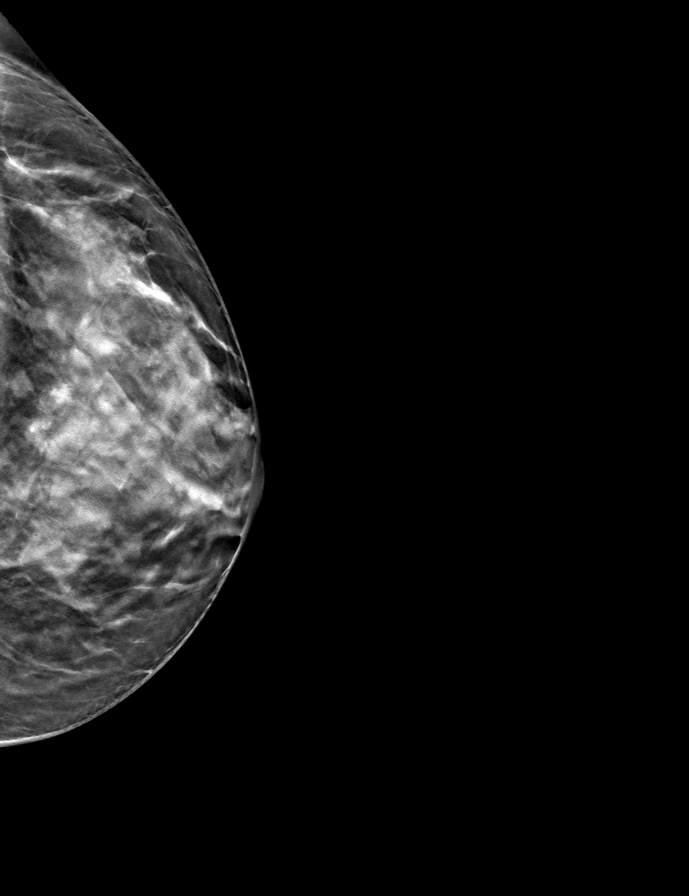
[frame 29/56]
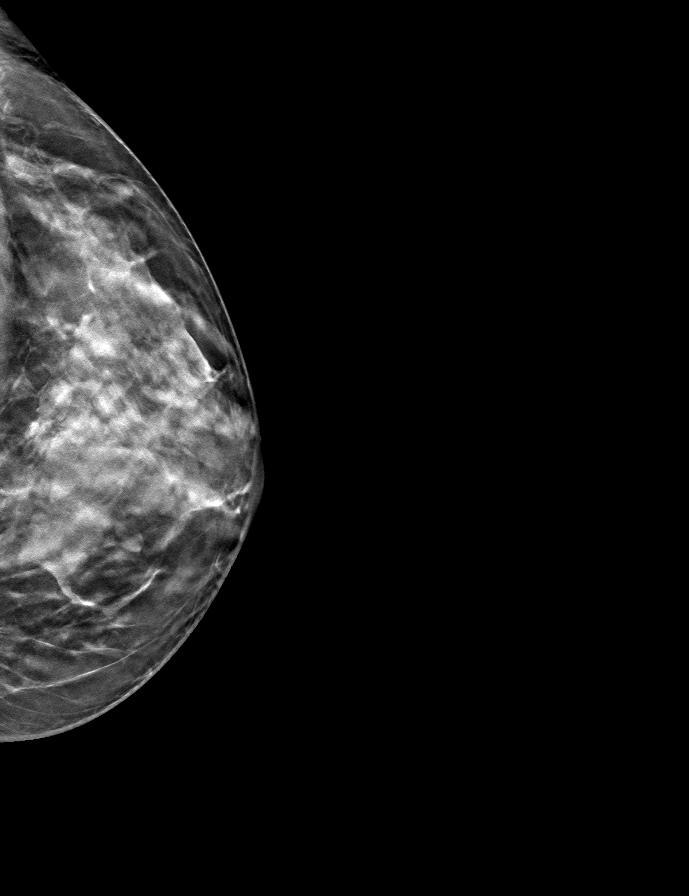

[L MLO tomo · tomo slice 26/51.0]
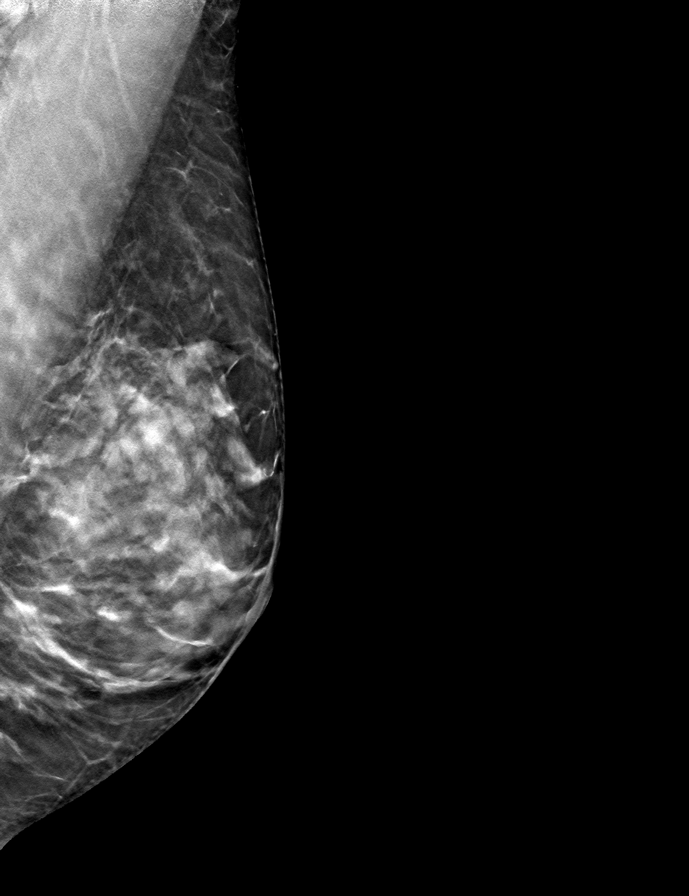

[R MLO tomo · tomo slice 28/55.0]
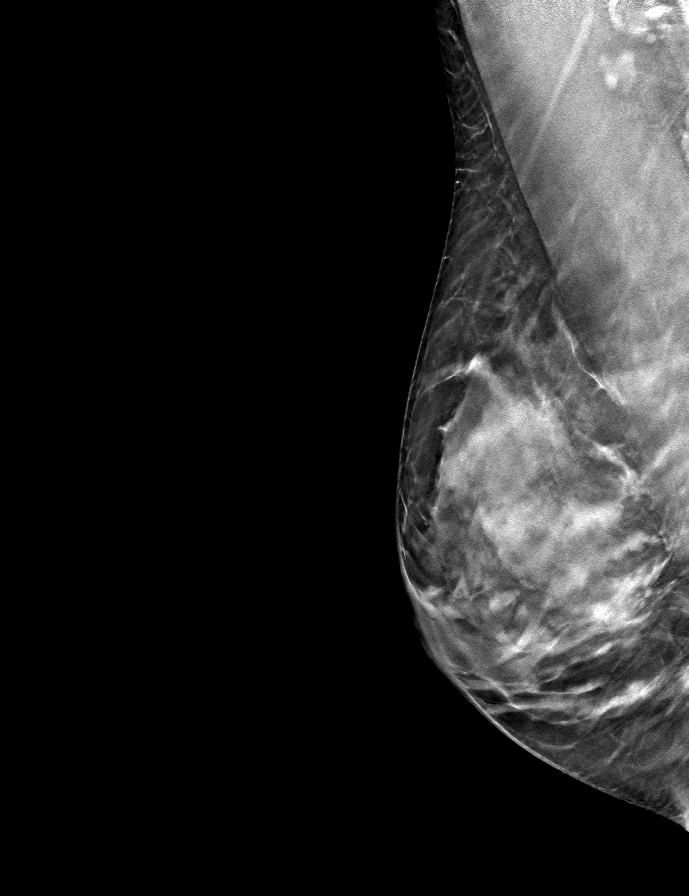

[R CC tomo · tomo slice 28/55.0]
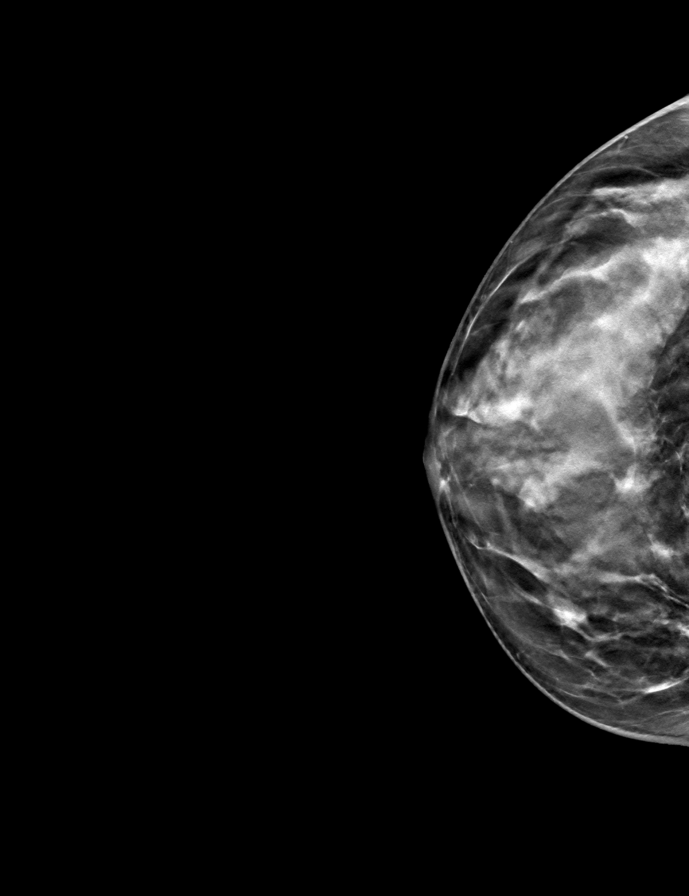

[9 of 24 positions shown; findings below may reference images not displayed]

ACR Breast Density Category c: The breast tissue is heterogeneously
dense, which may obscure small masses.
FINDINGS: There are no findings suspicious for malignancy. Images were
processed with CAD.
IMPRESSION: No mammographic evidence of malignancy. A result letter of this
screening mammogram will be mailed directly to the patient.

RECOMMENDATION:
Screening mammogram in one year. (Code:[id number])

BI-RADS CATEGORY  1: Negative.

## 2021-02-01 ENCOUNTER — Ambulatory Visit (INDEPENDENT_AMBULATORY_CARE_PROVIDER_SITE_OTHER): Payer: 59 | Admitting: Psychology

## 2021-02-01 DIAGNOSIS — F411 Generalized anxiety disorder: Secondary | ICD-10-CM

## 2021-02-01 DIAGNOSIS — F331 Major depressive disorder, recurrent, moderate: Secondary | ICD-10-CM | POA: Diagnosis not present

## 2021-02-01 NOTE — Progress Notes (Signed)
LASSIE DEMOREST is a 46 y.o. 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 (Status: maintained) -- No Description Entered  Excessive and/or unrealistic worry that is difficult to control occurring more days than not for at least 6 months about a number of events or activities. (Status: maintained) -- No Description Entered  Lack of communication with the partner. (Status: maintained) -- No Description Entered  Medication Status compliance  Safety none  If Suicidal or Homicidal State Action Taken: unspecified  Current Risk: low Medications Ambien (Dosage: unknown)  Objectives Related Problem: Develop healthy interpersonal relationships that lead to the alleviation and help prevent the relapse of depression. Description: Describe current and past experiences with depression including their impact on functioning and attempts to resolve it. Target Date: 2021-04-17 Frequency: Daily Modality: individual Progress: 20%  Related Problem: Develop healthy interpersonal relationships that lead to the alleviation and help prevent the relapse of depression. Description: Verbalize an understanding of healthy and unhealthy emotions with the intent of increasing the use of healthy emotions to guide actions. Target Date: 2021-04-17 Frequency: Daily Modality: individual Progress: 30%  Related Problem: Develop healthy interpersonal relationships that lead to the alleviation and help prevent the relapse of depression. Description: Identify and replace thoughts and beliefs that support depression. Target Date: 2021-04-17 Frequency: Daily Modality: individual Progress: 30%  Related Problem: Develop the necessary skills for effective, open communication, mutually satisfying sexual intimacy, and enjoyable time for companionship within  the relationship. Description: Identify problems and strengths in the relationship, including one's own role in each. Target Date: 2021-04-17 Frequency: Daily Modality: individual Progress: 30%  Related Problem: Develop the necessary skills for effective, open communication, mutually satisfying sexual intimacy, and enjoyable time for companionship within the relationship. Description: Learn and implement problem-solving and conflict resolution skills. Target Date: 2021-04-17 Frequency: Daily Modality: individual Progress: 30%  Related Problem: Learn and implement coping skills that result in a reduction of anxiety and worry, and improved daily functioning. Description: Learn and implement calming skills to reduce overall anxiety and manage anxiety symptoms. Target Date: 2021-04-17 Frequency: Daily Modality: individual Progress: 0%  Related Problem: Learn and implement coping skills that result in a reduction of anxiety and worry, and improved daily functioning. Description: Learn and implement personal and interpersonal skills to reduce anxiety and improve interpersonal relationships. Target Date: 2021-04-17 Frequency: Daily Modality: individual Progress: 0%  Related Problem: Learn and implement coping skills that result in a reduction of anxiety and worry, and improved daily functioning. Description: Learn and implement problem-solving strategies for realistically addressing worries. Target Date: 2021-04-17 Frequency: Daily Modality: individual Progress: 0%  Related Problem: Learn and implement coping skills that result in a reduction of anxiety and worry, and improved daily functioning. Description: Describe situations, thoughts, feelings, and actions associated with anxieties and worries, their impact on functioning, and attempts to resolve them. Target Date: 2020-09-17 Frequency: Daily Modality: individual Progress: 0%  Client Response full compliance  Service  Location Location, [address] [place], [place] [address]  Service Code cpt [phone]  Relaxation training  Self-monitoring  Self care activities  Emotion regulation skills  Identify automatic thoughts  Identified an insight  Facilitate problem solving  Normalize/Reframe  Behavioral activation plan  Session notes:  Dx.: Depression and Anxiety  Meds.: Ambien  Goals: Seeking therapy to manage tendency to be over-controlling in life. Is experiencing some symptoms of both anxiety  and depression. She also is married to an active alcoholic and needs strategies to manage. Goal date is 6-23. Also expresses a desire to improve physical intimacy with husband. Goal date is 6-23.  Patient agrees to a Brewing technologist meeting due to the pandemic. She is at home and I am at my home office. [name] says that the holiday went well, "better than expected". Last year was much worse with regard to her brother and sister in laws behavior. Everyone was "better behaved" and they did a much better job of setting boundaries and expectations. She was very aware of the amount of alcohol consumed among family members and did feel a need to confront her brother about his wife's consumption. When asked about husband's beer consumption over the holiday, she states that she was not "annoyed' at his behavior because she let go of trying to control him and they were not in a situation in which she was concerned about the perceptions of others.  She brought up that she and her husband have not had sex in years. She brought this up to him that this is a problem and it needs to be addressed. He responded by saying "I know". She says that this is positive for him, as he tends to avoid any difficult topics. She is wanting to get re-engaged with intimacy with her husband. We discussed the strategy on how to get started with discussing intimacy. Focused on the importance of a gradual and collaborative process. She says she will take the  lead. Her goal is to have a more substantive discussion with husband before our next session.           Garrel Ridgel, PhD Time: 8:40-9:30 50 min

## 2021-03-10 ENCOUNTER — Ambulatory Visit: Payer: 59 | Admitting: Psychology

## 2021-04-12 ENCOUNTER — Ambulatory Visit (INDEPENDENT_AMBULATORY_CARE_PROVIDER_SITE_OTHER): Payer: 59 | Admitting: Psychology

## 2021-04-12 DIAGNOSIS — F331 Major depressive disorder, recurrent, moderate: Secondary | ICD-10-CM | POA: Diagnosis not present

## 2021-04-12 NOTE — Progress Notes (Addendum)
? ? ? ? ? ? ? ? ? ? ? ? ? ? ?Martha Lewis is a 46 y.o. female patient  ?04/12/2021  ?Treatment Plan ? 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 (Status: maintained) -- No Description Entered  ?Excessive and/or unrealistic worry that is difficult to control occurring more days than not for at least 6 months about a number of events or activities. (Status: maintained) -- No Description Entered  ?Lack of communication with the partner. (Status: maintained) -- No Description Entered  ?Medication Status ?compliance  ?Safety ?none  ?If Suicidal or Homicidal State Action Taken: unspecified  ?Current Risk: low ?Medications ?Ambien (Dosage: unknown)  ?Objectives ?Related Problem: Develop healthy interpersonal relationships that lead to the alleviation and help prevent the relapse of depression. ?Description: Describe current and past experiences with depression including their impact on functioning and attempts to resolve it. ?Target Date: 2022-01-17 ?Frequency: Daily ?Modality: individual ?Progress: 20%  ?Related Problem: Develop healthy interpersonal relationships that lead to the alleviation and help prevent the relapse of depression. ?Description: Verbalize an understanding of healthy and unhealthy emotions with the intent of increasing the use of healthy emotions to guide actions. ?Target Date: 2022-01-17 ?Frequency: Daily ?Modality: individual ?Progress: 30%  ?Related Problem: Develop healthy interpersonal relationships that lead to the alleviation and help prevent the relapse of depression. ?Description: Identify and replace thoughts and beliefs that support depression. ?Target Date: 2022-01-17 ?Frequency: Daily ?Modality: individual ?Progress: 30% ? ?Related Problem: Develop the necessary skills for effective, open communication, mutually satisfying sexual intimacy, and enjoyable  time for companionship within the relationship. ?Description: Identify problems and strengths in the relationship, including one's own role in each. ?Target Date: 2022-01-17 ?Frequency: Daily ?Modality: individual ?Progress: 30% ? ?Related Problem: Develop the necessary skills for effective, open communication, mutually satisfying sexual intimacy, and enjoyable time for companionship within the relationship. ?Description: Learn and implement problem-solving and conflict resolution skills. ?Target Date: 2022-01-17 ?Frequency: Daily ?Modality: individual ?Progress: 30% ? ?Related Problem: Learn and implement coping skills that result in a reduction of anxiety and worry, and improved daily functioning. ?Description: Learn and implement calming skills to reduce overall anxiety and manage anxiety symptoms. ?Target Date: 2022-01-17 ?Frequency: Daily ?Modality: individual ?Progress: 0% ? ?Related Problem: Learn and implement coping skills that result in a reduction of anxiety and worry, and improved daily functioning. ?Description: Learn and implement personal and interpersonal skills to reduce anxiety and improve interpersonal relationships. ?Target Date: 2022-01-17 ?Frequency: Daily ?Modality: individual ?Progress: 0%  ?Related Problem: Learn and implement coping skills that result in a reduction of anxiety and worry, and improved daily functioning. ?Description: Learn and implement problem-solving strategies for realistically addressing worries. ?Target Date: 2022-01-17 ?Frequency: Daily ?Modality: individual ?Progress: 0% ? ?Related Problem: Learn and implement coping skills that result in a reduction of anxiety and worry, and improved daily functioning. ?Description: Describe situations, thoughts, feelings, and actions associated with anxieties and worries, their impact on functioning, and attempts to resolve them. ?Target Date: 2022-01-17 ?Frequency: Daily ?Modality: individual ?Progress: 50% ? ?Client Response ?full  compliance  ?Service Location ?Location, [address] [place], [place] [address]  ?Service Code ?cpt [id number]  ?Relaxation training  ?Self-monitoring  ?Self care activities  ?Emotion regulation skills  ?Identify automatic thoughts  ?Identified an insight  ?Facilitate problem solving  ?Normalize/Reframe  ?Behavioral activation plan  ?Session notes:  ?Dx.: Depression and Anxiety  ?Meds.: Ambien  ?Goals: Seeking therapy to  manage tendency to be over-controlling in life. Is experiencing some symptoms of both anxiety and depression. She also is married to an active alcoholic and needs strategies to manage. Goal date is 12-23. Also expresses a desire to improve physical intimacy with husband. Goal date is 12-23.  ?Patient agrees to a Public relations account executive meeting due to the pandemic. She is at home and I am at my home office. ?Martha Lewis says she has been off and on sick since returning to work. She got Covid 19 for the first time. She did not get very sick, but it was an inconvenience. She is trying to get back into a routine. In addition, she had a bout of vertigo. She has just now started back into the office.  ?For work, she is trying to power through and it has been a challenging time. 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 She is not a fan of being in the office.  ?She said she and husband have not made any advancement with intimacy. They have had some useful discussion and both agree it needs to be worked on. Now they need to make time. She admits that unless it is "scheduled" it is too easy to avoid. She is clear that she does not want to talk with him when he has been drinking. He is not generally a "talker", but did express his lack of esteem regarding his physical appearance. She knows that when she is stressed, she is hard on him and he internalizes it. We talked about the problem of his excessive drinking 3-4 times/week. It has clearly interfered with their intimacy. She is "annoyed" by his drinking as it has  created a distance between them. She is just now realizing the "inter-connection" with his drinking and their intimacy. She now realizes she and they need to change patterns. We talked about appreciating that this change will take time, but requires a commitment on both of their parts. She has schedule appointment through the rest of the year to address these issues.         ?  ?  ?Marcelina Morel, PhD Time: 8:40-9:30 50 min ?

## 2021-05-10 ENCOUNTER — Ambulatory Visit: Payer: 59 | Admitting: Psychology

## 2021-06-07 ENCOUNTER — Ambulatory Visit (INDEPENDENT_AMBULATORY_CARE_PROVIDER_SITE_OTHER): Payer: 59 | Admitting: Psychology

## 2021-06-07 DIAGNOSIS — F331 Major depressive disorder, recurrent, moderate: Secondary | ICD-10-CM | POA: Diagnosis not present

## 2021-06-07 NOTE — Progress Notes (Signed)
? ? ? ? ? ?Martha Lewis is a 46 y.o. female patient  ?06/07/2021  ?Treatment Plan ? 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 (Status: maintained) -- No Description Entered  ?Excessive and/or unrealistic worry that is difficult to control occurring more days than not for at least 6 months about a number of events or activities. (Status: maintained) -- No Description Entered  ?Lack of communication with the partner. (Status: maintained) -- No Description Entered  ?Medication Status ?compliance  ?Safety ?none  ?If Suicidal or Homicidal State Action Taken: unspecified  ?Current Risk: low ?Medications ?Ambien (Dosage: unknown)  ?Objectives ?Related Problem: Develop healthy interpersonal relationships that lead to the alleviation and help prevent the relapse of depression. ?Description: Describe current and past experiences with depression including their impact on functioning and attempts to resolve it. ?Target Date: 2022-01-17 ?Frequency: Daily ?Modality: individual ?Progress: 20%  ?Related Problem: Develop healthy interpersonal relationships that lead to the alleviation and help prevent the relapse of depression. ?Description: Verbalize an understanding of healthy and unhealthy emotions with the intent of increasing the use of healthy emotions to guide actions. ?Target Date: 2022-01-17 ?Frequency: Daily ?Modality: individual ?Progress: 30%  ?Related Problem: Develop healthy interpersonal relationships that lead to the alleviation and help prevent the relapse of depression. ?Description: Identify and replace thoughts and beliefs that support depression. ?Target Date: 2022-01-17 ?Frequency: Daily ?Modality: individual ?Progress: 30% ? ?Related Problem: Develop the necessary skills for effective, open communication, mutually satisfying sexual intimacy, and enjoyable time for  companionship within the relationship. ?Description: Identify problems and strengths in the relationship, including one's own role in each. ?Target Date: 2022-01-17 ?Frequency: Daily ?Modality: individual ?Progress: 30% ? ?Related Problem: Develop the necessary skills for effective, open communication, mutually satisfying sexual intimacy, and enjoyable time for companionship within the relationship. ?Description: Learn and implement problem-solving and conflict resolution skills. ?Target Date: 2022-01-17 ?Frequency: Daily ?Modality: individual ?Progress: 30% ? ?Related Problem: Learn and implement coping skills that result in a reduction of anxiety and worry, and improved daily functioning. ?Description: Learn and implement calming skills to reduce overall anxiety and manage anxiety symptoms. ?Target Date: 2022-01-17 ?Frequency: Daily ?Modality: individual ?Progress: 0% ? ?Related Problem: Learn and implement coping skills that result in a reduction of anxiety and worry, and improved daily functioning. ?Description: Learn and implement personal and interpersonal skills to reduce anxiety and improve interpersonal relationships. ?Target Date: 2022-01-17 ?Frequency: Daily ?Modality: individual ?Progress: 0%  ?Related Problem: Learn and implement coping skills that result in a reduction of anxiety and worry, and improved daily functioning. ?Description: Learn and implement problem-solving strategies for realistically addressing worries. ?Target Date: 2022-01-17 ?Frequency: Daily ?Modality: individual ?Progress: 0% ? ?Related Problem: Learn and implement coping skills that result in a reduction of anxiety and worry, and improved daily functioning. ?Description: Describe situations, thoughts, feelings, and actions associated with anxieties and worries, their impact on functioning, and attempts to resolve them. ?Target Date: 2022-01-17 ?Frequency: Daily ?Modality: individual ?Progress: 50% ? ?Client Response ?full  compliance  ?Service Location ?Location, [address] [place], [place] [address]  ?Service Code ?cpt [id number]  ?Relaxation training  ?Self-monitoring  ?Self care activities  ?Emotion regulation skills  ?Identify automatic thoughts  ?Identified an insight  ?Facilitate problem solving  ?Normalize/Reframe  ?Behavioral activation plan  ?Session notes:  ?Dx.: Depression and Anxiety  ?Meds.: Ambien  ?Goals: Seeking therapy to manage tendency to be over-controlling in life. Is experiencing  some symptoms of both anxiety and depression. She also is married to an active alcoholic and needs strategies to manage. Goal date is 12-23. Also expresses a desire to improve physical intimacy with husband. Goal date is 12-23.  ?Patient agrees to a Brewing technologist meeting due to the pandemic. She is at home and I am at my home office. ?Martha Lewis says she has been busy at work. She says that the situation with dealing with her marital relationship has been harder than she anticipated. In order to have a more intimate relationship, she feels she has had to go back to "dating" again to feel closer. She says she needs to feel connected in order to feel intimacy. Sh struggles with the vulnerability of a sexual relationship. She is invested in control and it is difficult for her to "let go". She says they are trying to prioritize the relationship and are doing things to support that effort. The "vision" she had of this process is different than the reality. She says her inability to relax is as much of a factor in intimacy as her ability to relax. She does not feel her husband is interested in reducing alcohol intake. She recognizes she is making almost all of the effort in the relationship. We talked about this not being sustainable and the need for his participation.              ?  ?  ?Garrel Ridgel, PhD Time: 8:40-9:30 50 min ?

## 2021-06-22 ENCOUNTER — Other Ambulatory Visit: Payer: Self-pay | Admitting: Obstetrics & Gynecology

## 2021-06-22 DIAGNOSIS — Z1231 Encounter for screening mammogram for malignant neoplasm of breast: Secondary | ICD-10-CM

## 2021-07-12 ENCOUNTER — Ambulatory Visit (INDEPENDENT_AMBULATORY_CARE_PROVIDER_SITE_OTHER): Payer: 59 | Admitting: Psychology

## 2021-07-12 DIAGNOSIS — F331 Major depressive disorder, recurrent, moderate: Secondary | ICD-10-CM

## 2021-07-12 NOTE — Progress Notes (Signed)
Martha Lewis is a 46 y.o. 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 (Status: maintained) -- No Description Entered  Excessive and/or unrealistic worry that is difficult to control occurring more days than not for at least 6 months about a number of events or activities. (Status: maintained) -- No Description Entered  Lack of communication with the partner. (Status: maintained) -- No Description Entered  Medication Status compliance  Safety none  If Suicidal or Homicidal State Action Taken: unspecified  Current Risk: low Medications Ambien (Dosage: unknown)  Objectives Related Problem: Develop healthy interpersonal relationships that lead to the alleviation and help prevent the relapse of depression. Description: Describe current and past experiences with depression including their impact on functioning and attempts to resolve it. Target Date: 2022-01-17 Frequency: Daily Modality: individual Progress: 75%  Related Problem: Develop healthy interpersonal relationships that lead to the alleviation and help prevent the relapse of depression. Description: Verbalize an understanding of healthy and unhealthy emotions with the intent of increasing the use of healthy emotions to guide actions. Target Date: 2022-01-17 Frequency: Daily Modality: individual Progress: 50%  Related Problem: Develop healthy interpersonal relationships that lead to the alleviation and help prevent the relapse of depression. Description: Identify and replace thoughts and beliefs that support depression. Target Date: 2022-01-17 Frequency: Daily Modality: individual Progress: 50%  Related Problem: Develop the necessary skills for effective, open communication, mutually satisfying sexual intimacy, and enjoyable time  for companionship within the relationship. Description: Identify problems and strengths in the relationship, including one's own role in each. Target Date: 2022-01-17 Frequency: Daily Modality: individual Progress: 75%  Related Problem: Develop the necessary skills for effective, open communication, mutually satisfying sexual intimacy, and enjoyable time for companionship within the relationship. Description: Learn and implement problem-solving and conflict resolution skills. Target Date: 2022-01-17 Frequency: Daily Modality: individual Progress: 75%  Related Problem: Learn and implement coping skills that result in a reduction of anxiety and worry, and improved daily functioning. Description: Learn and implement calming skills to reduce overall anxiety and manage anxiety symptoms. Target Date: 2022-01-17 Frequency: Daily Modality: individual Progress: 75%  Related Problem: Learn and implement coping skills that result in a reduction of anxiety and worry, and improved daily functioning. Description: Learn and implement personal and interpersonal skills to reduce anxiety and improve interpersonal relationships. Target Date: 2022-01-17 Frequency: Daily Modality: individual Progress: 50%  Related Problem: Learn and implement coping skills that result in a reduction of anxiety and worry, and improved daily functioning. Description: Learn and implement problem-solving strategies for realistically addressing worries. Target Date: 2022-01-17 Frequency: Daily Modality: individual Progress: 50%  Related Problem: Learn and implement coping skills that result in a reduction of anxiety and worry, and improved daily functioning. Description: Describe situations, thoughts, feelings, and actions associated with anxieties and worries, their impact on functioning, and attempts to resolve them. Target Date: 2022-01-17 Frequency: Daily Modality: individual Progress: 75%  Client Response full  compliance  Service Location Location, [address] [place], [place] [address]  Service Code cpt [phone]  Relaxation training  Self-monitoring  Self care activities  Emotion regulation skills  Identify automatic thoughts  Identified an insight  Facilitate problem solving  Normalize/Reframe  Behavioral activation plan  Session notes:  Dx.: Depression and Anxiety  Meds.: Ambien  Goals: Seeking therapy to manage tendency  to be over-controlling in life. Is experiencing some symptoms of both anxiety and depression. She also is married to an active alcoholic and needs strategies to manage. Goal date is 12-23. Also expresses a desire to improve physical intimacy with husband. Goal date is 12-23.  Patient agrees to a Brewing technologist meeting due to the pandemic. She is at home and I am at my home office. Martha Lewis says work is tiring and stressful. In addition, a friend of hers has been diagnosed with stage 4 cancer. This woman also lost a son to a drug over dose 2 years ago. Martha Lewis says she has witnessed a huge outpouring for her friend. It is an "unusual" experience for Martha Lewis, who is a very private person. She says that she and her husband are doing well. Went to a family wedding which typically would be stressful for Martha Lewis. She feels that she and Martha Lewis were in a good place and it was easier for her to adapt to the difficult situation of being with his family. Intimacy has not improved, but she does say she feels more connected to her husband than before. She says this is important for her before it becomes physical. She needs that closeness for intimacy.  Martha Lewis still does not see the need to reduce alcohol intake and she does not express that need to him. Martha Lewis is not especially driven with work and she is frequently trying to help him focus. She does not feel this creates any discord and it is more complimentary. Will be taking some vacation time with husband and will work to advance greater  intimacy and communication.                       Martha Ridgel, PhD Time: 8:40-9:30 50 min

## 2021-07-20 ENCOUNTER — Telehealth: Payer: Self-pay | Admitting: *Deleted

## 2021-07-20 DIAGNOSIS — N951 Menopausal and female climacteric states: Secondary | ICD-10-CM

## 2021-07-20 NOTE — Telephone Encounter (Signed)
Patient scheduled for [facility] check on 08/07/21 per note on 10/19/20 " Patient explained that [facility] can be variable at the time of perimenopause and that by definition menopause is a clinical diagnosis after 1 year without menstrual periods off of any contraception or hormonal treatment"   Patient asked if "full hormone testing" could be drawn including TSH. If approved please provide the diagnosis as well.   Please advise

## 2021-08-01 NOTE — Telephone Encounter (Signed)
Dr. Seymour Bars patient asked if you don't think other "hormone testing" should be done such estradiol level and other hormones test. If you don't think it won't be beneficial than she will just to [facility] and TSH next week while off BCP. Please advise

## 2021-08-07 ENCOUNTER — Other Ambulatory Visit: Payer: 59

## 2021-08-07 DIAGNOSIS — N951 Menopausal and female climacteric states: Secondary | ICD-10-CM

## 2021-08-07 NOTE — Telephone Encounter (Signed)
Dr.Lavoie replied "Yes, FSH and TSH is all she needs. "  Order placed.

## 2021-08-10 ENCOUNTER — Encounter: Payer: Self-pay | Admitting: Obstetrics & Gynecology

## 2021-08-10 ENCOUNTER — Ambulatory Visit (INDEPENDENT_AMBULATORY_CARE_PROVIDER_SITE_OTHER): Payer: 59 | Admitting: Obstetrics & Gynecology

## 2021-08-10 ENCOUNTER — Other Ambulatory Visit (HOSPITAL_COMMUNITY)
Admission: RE | Admit: 2021-08-10 | Discharge: 2021-08-10 | Disposition: A | Discharge status: Home / Self Care | Payer: 59 | Source: Ambulatory Visit | Attending: Obstetrics & Gynecology | Admitting: Obstetrics & Gynecology

## 2021-08-10 VITALS — BP 100/60 | HR 84 | Ht 62.5 in | Wt 131.0 lb

## 2021-08-10 DIAGNOSIS — Z01419 Encounter for gynecological examination (general) (routine) without abnormal findings: Secondary | ICD-10-CM | POA: Diagnosis present

## 2021-08-10 DIAGNOSIS — N951 Menopausal and female climacteric states: Secondary | ICD-10-CM | POA: Diagnosis not present

## 2021-08-10 DIAGNOSIS — R7989 Other specified abnormal findings of blood chemistry: Secondary | ICD-10-CM | POA: Diagnosis not present

## 2021-08-10 DIAGNOSIS — Z3041 Encounter for surveillance of contraceptive pills: Secondary | ICD-10-CM

## 2021-08-10 LAB — T3, FREE: T3, Free: 3 pg/mL (ref 2.3–4.2)

## 2021-08-10 LAB — T4, FREE: Free T4: 1 ng/dL (ref 0.8–1.8)

## 2021-08-10 LAB — TEST AUTHORIZATION

## 2021-08-10 LAB — TSH: TSH: 4.86 mIU/L — ABNORMAL HIGH

## 2021-08-10 LAB — FOLLICLE STIMULATING HORMONE: FSH: 54.6 m[IU]/mL

## 2021-08-10 NOTE — Progress Notes (Signed)
Martha Lewis Nov 13, 1975 [id number]   History:    46 y.o. G0 Married.  Works from home.   RP:  Established patient presenting for annual gyn exam    HPI: On Blisovi continuously. No menses.  No breakthrough bleeding.  No pelvic pain.  Increased hot flushes and night sweats.  Very upset that she gained 5 Lbs recently in spite of continuing the same intense fitness programs including Pure Bar and very good nutrition.  BMI increased from 21.96 last year to 23.58 now.  Labs 08/07/21 FSH menopausal at 54.6 and TSH mildly increased at 4.86 and FT3 normal at 3.0 and FT4 normal at 1.0.  No pain with intercourse.  No h/o abnormal Pap.  Pap Neg in 07/2018.  Pap reflex today.  Urine and bowel movements normal.  Breasts normal.  Mammo Neg 08/2020. Mother with breast cancer in her 45s.  Health labs with Dr. Timothy Lasso.   Past medical history,surgical history, family history and social history were all reviewed and documented in the EPIC chart.  Gynecologic History Patient's last menstrual period was 11/03/2015 (lmp unknown).  Obstetric History OB History  Gravida Para Term Preterm AB Living  0 0 0 0 0 0  SAB IAB Ectopic Multiple Live Births  0 0 0 0 0     ROS: A ROS was performed and pertinent positives and negatives are included in the history.  GENERAL: No fevers or chills. HEENT: No change in vision, no earache, sore throat or sinus congestion. NECK: No pain or stiffness. CARDIOVASCULAR: No chest pain or pressure. No palpitations. PULMONARY: No shortness of breath, cough or wheeze. GASTROINTESTINAL: No abdominal pain, nausea, vomiting or diarrhea, melena or bright red blood per rectum. GENITOURINARY: No urinary frequency, urgency, hesitancy or dysuria. MUSCULOSKELETAL: No joint or muscle pain, no back pain, no recent trauma. DERMATOLOGIC: No rash, no itching, no lesions. ENDOCRINE: No polyuria, polydipsia, no heat or cold intolerance. No recent change in weight. HEMATOLOGICAL: No anemia or easy  bruising or bleeding. NEUROLOGIC: No headache, seizures, numbness, tingling or weakness. PSYCHIATRIC: No depression, no loss of interest in normal activity or change in sleep pattern.     Exam:   BP 100/60   Pulse 84   Ht 5' 2.5" (1.588 m)   Wt 131 lb (59.4 kg)   LMP 11/03/2015 (LMP Unknown) Comment: ocp  SpO2 99%   BMI 23.58 kg/m   Body mass index is 23.58 kg/m.  General appearance : Well developed well nourished female. No acute distress HEENT: Eyes: no retinal hemorrhage or exudates,  Neck supple, trachea midline, no carotid bruits, no thyroidmegaly Lungs: Clear to auscultation, no rhonchi or wheezes, or rib retractions  Heart: Regular rate and rhythm, no murmurs or gallops Breast:Examined in sitting and supine position were symmetrical in appearance, no palpable masses or tenderness,  no skin retraction, no nipple inversion, no nipple discharge, no skin discoloration, no axillary or supraclavicular lymphadenopathy Abdomen: no palpable masses or tenderness, no rebound or guarding Extremities: no edema or skin discoloration or tenderness  Pelvic: Vulva: Normal             Vagina: No gross lesions or discharge  Cervix: No gross lesions or discharge.  Pap reflex done.  Uterus  AV, normal size, shape and consistency, non-tender and mobile  Adnexa  Without masses or tenderness  Anus: Normal   Assessment/Plan:  46 y.o. female for annual exam   1. Encounter for routine gynecological examination with Papanicolaou smear of cervix On Blisovi  continuously. No menses.  No breakthrough bleeding.  No pelvic pain.  Increased hot flushes and night sweats.  Very upset that she gained 5 Lbs recently in spite of continuing the same intense fitness programs including Pure Bar and very good nutrition.  BMI increased from 21.96 last year to 23.58 now.  Labs 08/07/21 FSH menopausal at 54.6 and TSH mildly increased at 4.86 and FT3 normal at 3.0 and FT4 normal at 1.0.  No pain with intercourse.  No h/o  abnormal Pap.  Pap Neg in 07/2018.  Pap reflex today.  Urine and bowel movements normal.  Breasts normal.  Mammo Neg 08/2020. Mother with breast cancer in her 52s.  Health labs with Dr. Timothy Lasso. - Cytology - PAP( [facility] [facility])  2. Encounter for surveillance of contraceptive pills Stop BCPs.  Will use condoms.  3. Menopausal syndrome FSH in menopausal range.  Will observe x 3 months and f/u then to discuss HRT.  4. High serum thyroid stimulating hormone (TSH) TSH mildly elevated, but FT3, FT4 normal.  Increased weight.  Will discuss management with Dr Timothy Lasso at next visit.  Other orders - zolpidem (AMBIEN) 10 MG tablet; Take by mouth. (Patient not taking: Reported on 08/10/2021) - UNABLE TO FIND; Med Name: thyroid support with iodine   Genia Del MD, 3:33 PM 08/10/2021

## 2021-08-14 ENCOUNTER — Encounter: Payer: Self-pay | Admitting: Obstetrics & Gynecology

## 2021-08-14 LAB — CYTOLOGY - PAP: Diagnosis: NEGATIVE

## 2021-09-06 ENCOUNTER — Ambulatory Visit (INDEPENDENT_AMBULATORY_CARE_PROVIDER_SITE_OTHER): Payer: 59 | Admitting: Psychology

## 2021-09-06 DIAGNOSIS — F331 Major depressive disorder, recurrent, moderate: Secondary | ICD-10-CM

## 2021-09-06 NOTE — Progress Notes (Signed)
CHEYENNA PANKOWSKI is a 46 y.o. 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 (Status: maintained) -- No Description Entered  Excessive and/or unrealistic worry that is difficult to control occurring more days than not for at least 6 months about a number of events or activities. (Status: maintained) -- No Description Entered  Lack of communication with the partner. (Status: maintained) -- No Description Entered  Medication Status compliance  Safety none  If Suicidal or Homicidal State Action Taken: unspecified  Current Risk: low Medications Ambien (Dosage: unknown)  Objectives Related Problem: Develop healthy interpersonal relationships that lead to the alleviation and help prevent the relapse of depression. Description: Describe current and past experiences with depression including their impact on functioning and attempts to resolve it. Target Date: 2022-01-17 Frequency: Daily Modality: individual Progress: 75%  Related Problem: Develop healthy interpersonal relationships that lead to the alleviation and help prevent the relapse of depression. Description: Verbalize an understanding of healthy and unhealthy emotions with the intent of increasing the use of healthy emotions to guide actions. Target Date: 2022-01-17 Frequency: Daily Modality: individual Progress: 50%  Related Problem: Develop healthy interpersonal relationships that lead to the alleviation and help prevent the relapse of depression. Description: Identify and replace thoughts and beliefs that support depression. Target Date: 2022-01-17 Frequency: Daily Modality: individual Progress: 50%  Related Problem: Develop the necessary skills for effective, open communication, mutually satisfying  sexual intimacy, and enjoyable time for companionship within the relationship. Description: Identify problems and strengths in the relationship, including one's own role in each. Target Date: 2022-01-17 Frequency: Daily Modality: individual Progress: 75%  Related Problem: Develop the necessary skills for effective, open communication, mutually satisfying sexual intimacy, and enjoyable time for companionship within the relationship. Description: Learn and implement problem-solving and conflict resolution skills. Target Date: 2022-01-17 Frequency: Daily Modality: individual Progress: 75%  Related Problem: Learn and implement coping skills that result in a reduction of anxiety and worry, and improved daily functioning. Description: Learn and implement calming skills to reduce overall anxiety and manage anxiety symptoms. Target Date: 2022-01-17 Frequency: Daily Modality: individual Progress: 75%  Related Problem: Learn and implement coping skills that result in a reduction of anxiety and worry, and improved daily functioning. Description: Learn and implement personal and interpersonal skills to reduce anxiety and improve interpersonal relationships. Target Date: 2022-01-17 Frequency: Daily Modality: individual Progress: 50%  Related Problem: Learn and implement coping skills that result in a reduction of anxiety and worry, and improved daily functioning. Description: Learn and implement problem-solving strategies for realistically addressing worries. Target Date: 2022-01-17 Frequency: Daily Modality: individual Progress: 50%  Related Problem: Learn and implement coping skills that result in a reduction of anxiety and worry, and improved daily functioning. Description: Describe situations, thoughts, feelings, and actions associated with anxieties and worries, their impact on functioning, and attempts to resolve them. Target Date: 2022-01-17 Frequency: Daily Modality:  individual Progress: 75%  Client Response full compliance  Service Location Location, [address] [place], [place] [address]  Service Code cpt [phone]  Relaxation training  Self-monitoring  Self care activities  Emotion regulation skills  Identify automatic thoughts  Identified an insight  Facilitate problem solving  Normalize/Reframe  Behavioral activation plan  Session notes:  Dx.: Depression and Anxiety  Meds.: Ambien  Goals: Seeking therapy to manage tendency to be over-controlling in life. Is experiencing some symptoms of both anxiety and depression. She also is married to an active alcoholic and needs strategies to manage. Goal date is 12-23. Also expresses a desire to improve physical intimacy with husband. Goal date is 12-23.  Patient agrees to a Nurse, mental health. She is at home and I am at my home office. [name] says today is her first day back at work after 4 day vacation at [employer]. Work stress is the most it has ever been. She says she is also starting to go into menopause and it is irritating and uncomfortable. She has worn same size since college and it bothers her that she is gaining some weight. Struggling with coming to terms with the change. She wants to opt for hormone replacement. It is the lack of control that bothers her the most. We talked about tolerating the lack of control by having a plan.  She says all of this has effected her game plan around intimacy. Now that she is not as confident physically, she has pulled back from intimacy with her husband. They were doing much better after [place] trip, but then she started to gain weight and got concerned. Says that it "derailed" her and since she was driving everything, it all stopped. Husband does not initiate anything and she is always the driver. He did try to initiate intimacy and she did not respond positively. She has found that when she is more positive to husband, she gets more of what she is looking for.  She is more sensitive about her tone with husband. He is a non-conflict person and "easily goes into his shell". Husband's drinking continues to be excessive and she talked about trying to get him to be more self aware.                         Garrel Ridgel, PhD Time: 8:40-9:30 50 min

## 2021-09-11 ENCOUNTER — Ambulatory Visit
Admission: RE | Admit: 2021-09-11 | Discharge: 2021-09-11 | Disposition: A | Discharge status: Home / Self Care | Payer: 59 | Source: Ambulatory Visit | Attending: Obstetrics & Gynecology | Admitting: Obstetrics & Gynecology

## 2021-09-11 DIAGNOSIS — Z1231 Encounter for screening mammogram for malignant neoplasm of breast: Secondary | ICD-10-CM

## 2021-10-11 ENCOUNTER — Ambulatory Visit (INDEPENDENT_AMBULATORY_CARE_PROVIDER_SITE_OTHER): Payer: 59 | Admitting: Psychology

## 2021-10-11 DIAGNOSIS — F331 Major depressive disorder, recurrent, moderate: Secondary | ICD-10-CM | POA: Diagnosis not present

## 2021-10-11 NOTE — Progress Notes (Signed)
Martha Lewis is a 46 y.o. 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 (Status: maintained) -- No Description Entered  Excessive and/or unrealistic worry that is difficult to control occurring more days than not for at least 6 months about a number of events or activities. (Status: maintained) -- No Description Entered  Lack of communication with the partner. (Status: maintained) -- No Description Entered  Medication Status compliance  Safety none  If Suicidal or Homicidal State Action Taken: unspecified  Current Risk: low Medications Ambien (Dosage: unknown)  Objectives Related Problem: Develop healthy interpersonal relationships that lead to the alleviation and help prevent the relapse of depression. Description: Describe current and past experiences with depression including their impact on functioning and attempts to resolve it. Target Date: 2022-01-17 Frequency: Daily Modality: individual Progress: 75%  Related Problem: Develop healthy interpersonal relationships that lead to the alleviation and help prevent the relapse of depression. Description: Verbalize an understanding of healthy and unhealthy emotions with the intent of increasing the use of healthy emotions to guide actions. Target Date: 2022-01-17 Frequency: Daily Modality: individual Progress: 50%  Related Problem: Develop healthy interpersonal relationships that lead to the alleviation and help prevent the relapse of depression. Description: Identify and replace thoughts and beliefs that support depression. Target Date: 2022-01-17 Frequency: Daily Modality: individual Progress: 50%  Related Problem: Develop the necessary skills for effective, open  communication, mutually satisfying sexual intimacy, and enjoyable time for companionship within the relationship. Description: Identify problems and strengths in the relationship, including one's own role in each. Target Date: 2022-01-17 Frequency: Daily Modality: individual Progress: 75%  Related Problem: Develop the necessary skills for effective, open communication, mutually satisfying sexual intimacy, and enjoyable time for companionship within the relationship. Description: Learn and implement problem-solving and conflict resolution skills. Target Date: 2022-01-17 Frequency: Daily Modality: individual Progress: 75%  Related Problem: Learn and implement coping skills that result in a reduction of anxiety and worry, and improved daily functioning. Description: Learn and implement calming skills to reduce overall anxiety and manage anxiety symptoms. Target Date: 2022-01-17 Frequency: Daily Modality: individual Progress: 75%  Related Problem: Learn and implement coping skills that result in a reduction of anxiety and worry, and improved daily functioning. Description: Learn and implement personal and interpersonal skills to reduce anxiety and improve interpersonal relationships. Target Date: 2022-01-17 Frequency: Daily Modality: individual Progress: 50%  Related Problem: Learn and implement coping skills that result in a reduction of anxiety and worry, and improved daily functioning. Description: Learn and implement problem-solving strategies for realistically addressing worries. Target Date: 2022-01-17 Frequency: Daily Modality: individual Progress: 50%  Related Problem: Learn and implement coping skills that result in a reduction of anxiety and worry, and improved daily functioning. Description: Describe situations, thoughts, feelings, and actions associated with anxieties and worries, their impact on functioning, and attempts to resolve them. Target Date:  2022-01-17 Frequency: Daily Modality: individual Progress: 75%  Client Response full compliance  Service Location Location, [address] [place], [place] [address]  Service Code cpt [phone]  Relaxation training  Self-monitoring  Self care activities  Emotion regulation skills  Identify automatic thoughts  Identified  an insight  Facilitate problem solving  Normalize/Reframe  Behavioral activation plan  Session notes:  Dx.: Depression and Anxiety  Meds.: Ambien  Goals: Seeking therapy to manage tendency to be over-controlling in life. Is experiencing some symptoms of both anxiety and depression. She also is married to an active alcoholic and needs strategies to manage. Goal date is 12-23. Also expresses a desire to improve physical intimacy with husband. Goal date is 12-23.  Patient agrees to a Forensic psychologist. She is at home and I am at my home office. Martha Lewis says that she is doing well. She is struggling with her body changes and is inhibited. She does feel that she is making better, more mature choices. With regard to their intimacy, she feels they are doing better of "leaning" in that direction. She feels that her self image is the current barrier. He has been patient with her inhibition. She also admits that her husband's lack of exercise and toning has historically been a factor that has interfered with their physical relationship. She is feeling that she is doing a better job of putting things in perspective in her life. She is "humbled" by friend that is battling with cancer.                            Marcelina Morel, PhD Time: 8:40-9:30a 50 min

## 2021-11-02 ENCOUNTER — Encounter: Payer: Self-pay | Admitting: Obstetrics & Gynecology

## 2021-11-02 ENCOUNTER — Ambulatory Visit (INDEPENDENT_AMBULATORY_CARE_PROVIDER_SITE_OTHER): Payer: 59 | Admitting: Obstetrics & Gynecology

## 2021-11-02 VITALS — BP 106/68 | HR 70

## 2021-11-02 DIAGNOSIS — N951 Menopausal and female climacteric states: Secondary | ICD-10-CM

## 2021-11-02 MED ORDER — PROGESTERONE MICRONIZED 100 MG PO CAPS: 100.0000 mg | ORAL_CAPSULE | Freq: Every day | ORAL | 4 refills | Status: DC

## 2021-11-02 MED ORDER — ESTRADIOL 0.1 MG/24HR TD PTTW: 1.0000 | MEDICATED_PATCH | TRANSDERMAL | 4 refills | Status: DC

## 2021-11-02 NOTE — Progress Notes (Signed)
    Martha Lewis [birth year] [id number]        46 y.o.  G0  RP: Menopausal syndrome for counseling and management with HRT  HPI: Stopped [place] Fe 1/20 in early 07/2021. A week later 08/07/21 FSH was at 54.6.  Increased hot flushes and night sweats. Gained weight recently in spite of continuing the same intense fitness programs including Pure Bar and very good nutrition.  BMI increased from 21.96 last year to 23.58 in July 2023.  No pain with IC.  No h/o stroke/DVT/PE.  Mother with breast Ca after menopause in her 28's.  OB History  Gravida Para Term Preterm AB Living  0 0 0 0 0 0  SAB IAB Ectopic Multiple Live Births  0 0 0 0 0    Past medical history,surgical history, problem list, medications, allergies, family history and social history were all reviewed and documented in the EPIC chart.   Directed ROS with pertinent positives and negatives documented in the history of present illness/assessment and plan.  Exam:  Vitals:   11/02/21 1457  BP: 106/68  Pulse: 70  SpO2: 99%   General appearance:  Normal  Gynecologic exam: Deferred  FSH 54.6 on 08/07/21 Screening mammo Neg 09/11/21   Assessment/Plan:  46 y.o. G0  1. Menopausal syndrome FSH was at 54.6.  Increased hot flushes and night sweats. Gained weight recently in spite of continuing the same intense fitness programs including Pure Bar and very good nutrition. BMI increased from 21.96 last year to 23.58 in July 2023.  No pain with IC.  No h/o stroke/DVT/PE.  Mother with breast Ca after menopause in her 58's.  Patient well informed of the benefits including Cardiac protection, Bones, Skin, Sex, Mind/Mood and risks including blood clots/stroke with age and a mild increase in risk of Breast Ca after 10 years on HRT.  Decision to start on the Estradiol patch 0.1 twice a week and Prometrium 100 mg/caps 1 caps HS daily. Usage reviewed. Prescriptions sent to pharmacy.  Other orders - tretinoin (RETIN-A) 0.05 % cream; Apply 1  Application topically at bedtime. - Black Pepper-Turmeric (TURMERIC COMPLEX/BLACK PEPPER PO) - Bacillus Coagulans-Inulin (PROBIOTIC) 1-250 BILLION-MG CAPS - estradiol (VIVELLE-DOT) 0.1 MG/24HR patch; Place 1 patch (0.1 mg total) onto the skin 2 (two) times a week. - progesterone (PROMETRIUM) 100 MG capsule; Take 1 capsule (100 mg total) by mouth at bedtime.   Princess Bruins MD, 3:04 PM 11/02/2021

## 2021-11-15 ENCOUNTER — Ambulatory Visit (INDEPENDENT_AMBULATORY_CARE_PROVIDER_SITE_OTHER): Payer: 59 | Admitting: Psychology

## 2021-11-15 DIAGNOSIS — F331 Major depressive disorder, recurrent, moderate: Secondary | ICD-10-CM

## 2021-11-15 NOTE — Progress Notes (Signed)
Martha Lewis is a 46 y.o. 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 (Status: maintained) -- No Description Entered  Excessive and/or unrealistic worry that is difficult to control occurring more days than not for at least 6 months about a number of events or activities. (Status: maintained) -- No Description Entered  Lack of communication with the partner. (Status: maintained) -- No Description Entered  Medication Status compliance  Safety none  If Suicidal or Homicidal State Action Taken: unspecified  Current Risk: low Medications Ambien (Dosage: unknown)  Objectives Related Problem: Develop healthy interpersonal relationships that lead to the alleviation and help prevent the relapse of depression. Description: Describe current and past experiences with depression including their impact on functioning and attempts to resolve it. Target Date: 2022-01-17 Frequency: Daily Modality: individual Progress: 75%  Related Problem: Develop healthy interpersonal relationships that lead to the alleviation and help prevent the relapse of depression. Description: Verbalize an understanding of healthy and unhealthy emotions with the intent of increasing the use of healthy emotions to guide actions. Target Date: 2022-01-17 Frequency: Daily Modality: individual Progress: 50%  Related Problem: Develop healthy interpersonal relationships that lead to the alleviation and help prevent the relapse of depression. Description: Identify and replace thoughts and beliefs that support depression. Target Date: 2022-01-17 Frequency: Daily Modality: individual Progress: 50%  Related Problem: Develop the necessary skills  for effective, open communication, mutually satisfying sexual intimacy, and enjoyable time for companionship within the relationship. Description: Identify problems and strengths in the relationship, including one's own role in each. Target Date: 2022-01-17 Frequency: Daily Modality: individual Progress: 75%  Related Problem: Develop the necessary skills for effective, open communication, mutually satisfying sexual intimacy, and enjoyable time for companionship within the relationship. Description: Learn and implement problem-solving and conflict resolution skills. Target Date: 2022-01-17 Frequency: Daily Modality: individual Progress: 75%  Related Problem: Learn and implement coping skills that result in a reduction of anxiety and worry, and improved daily functioning. Description: Learn and implement calming skills to reduce overall anxiety and manage anxiety symptoms. Target Date: 2022-01-17 Frequency: Daily Modality: individual Progress: 75%  Related Problem: Learn and implement coping skills that result in a reduction of anxiety and worry, and improved daily functioning. Description: Learn and implement personal and interpersonal skills to reduce anxiety and improve interpersonal relationships. Target Date: 2022-01-17 Frequency: Daily Modality: individual Progress: 50%  Related Problem: Learn and implement coping skills that result in a reduction of anxiety and worry, and improved daily functioning. Description: Learn and implement problem-solving strategies for realistically addressing worries. Target Date: 2022-01-17 Frequency: Daily Modality: individual Progress: 50%  Related Problem: Learn and implement coping skills that result in a reduction of anxiety and worry, and improved daily functioning. Description: Describe situations, thoughts, feelings, and actions associated with anxieties and worries, their impact on functioning, and attempts to resolve them. Target Date:  2022-01-17 Frequency: Daily Modality: individual Progress: 75%  Client Response full compliance  Service Location Location, [address] [place], [place] [address]  Service Code cpt [phone]  Relaxation training  Self-monitoring  Self care activities  Emotion regulation skills  Identify automatic thoughts  Identified an insight  Facilitate problem solving  Normalize/Reframe  Behavioral activation plan  Session notes:  Dx.: Depression and Anxiety  Meds.: Ambien  Goals: Seeking therapy to manage tendency to be over-controlling in life. Is experiencing some symptoms of both anxiety and depression. She also is married to an active alcoholic and needs strategies to manage. Goal date is 12-23. Also expresses a desire to improve physical intimacy with husband. Goal date is 12-23.  Patient agrees to a Nurse, mental health. She is at home and I am at my home office. [name] says she is "moving in the right direction. She started hormone replacement last week and she is hopeful this will help her libido. She will "test it out" for 3 months and make adjustments as needed. She is initially having some hormonal acne, which may last a few months. No other side effects to date. She is also working to change her diet so she can fit into her clothes. The changes in her body has been distressing for her. She is feeling better that she is taking control of her life and body. She does feel that she is getting much better of letting go of control and being (a little) more spontaneous. She reports that she and [name] are communicating more and they are doing well. They have had only one small "flare up". They have not made much progress on the intimacy, which she feels is related to her poor self body image. She feels she is more in sync with him and working well as partners. She is working harder to give her husband position Secretary/administrator. We talked about her expressing herself more clearly about her feelings.  We talked about how best to communicate.                               Garrel Ridgel, PhD Time: 8:45a-9:30a 45 min

## 2021-11-16 ENCOUNTER — Ambulatory Visit: Payer: 59 | Admitting: Obstetrics & Gynecology

## 2021-12-27 ENCOUNTER — Ambulatory Visit (INDEPENDENT_AMBULATORY_CARE_PROVIDER_SITE_OTHER): Payer: 59 | Admitting: Psychology

## 2021-12-27 DIAGNOSIS — F331 Major depressive disorder, recurrent, moderate: Secondary | ICD-10-CM

## 2021-12-27 NOTE — Progress Notes (Signed)
Martha Lewis is a 46 y.o. 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 (Status: maintained) -- No Description Entered  Excessive and/or unrealistic worry that is difficult to control occurring more days than not for at least 6 months about a number of events or activities. (Status: maintained) -- No Description Entered  Lack of communication with the partner. (Status: maintained) -- No Description Entered  Medication Status compliance  Safety none  If Suicidal or Homicidal State Action Taken: unspecified  Current Risk: low Medications Ambien (Dosage: unknown)  Objectives Related Problem: Develop healthy interpersonal relationships that lead to the alleviation and help prevent the relapse of depression. Description: Describe current and past experiences with depression including their impact on functioning and attempts to resolve it. Target Date: 2022-01-17 Frequency: Daily Modality: individual Progress: 75%  Related Problem: Develop healthy interpersonal relationships that lead to the alleviation and help prevent the relapse of depression. Description: Verbalize an understanding of healthy and unhealthy emotions with the intent of increasing the use of healthy emotions to guide actions. Target Date: 2022-01-17 Frequency: Daily Modality: individual Progress: 50%  Related Problem: Develop healthy interpersonal relationships that lead to the alleviation and help prevent the relapse of depression. Description: Identify and replace thoughts and beliefs that support depression. Target Date: 2022-01-17 Frequency: Daily Modality: individual Progress: 50%  Related Problem:  Develop the necessary skills for effective, open communication, mutually satisfying sexual intimacy, and enjoyable time for companionship within the relationship. Description: Identify problems and strengths in the relationship, including one's own role in each. Target Date: 2022-01-17 Frequency: Daily Modality: individual Progress: 75%  Related Problem: Develop the necessary skills for effective, open communication, mutually satisfying sexual intimacy, and enjoyable time for companionship within the relationship. Description: Learn and implement problem-solving and conflict resolution skills. Target Date: 2022-01-17 Frequency: Daily Modality: individual Progress: 75%  Related Problem: Learn and implement coping skills that result in a reduction of anxiety and worry, and improved daily functioning. Description: Learn and implement calming skills to reduce overall anxiety and manage anxiety symptoms. Target Date: 2022-01-17 Frequency: Daily Modality: individual Progress: 75%  Related Problem: Learn and implement coping skills that result in a reduction of anxiety and worry, and improved daily functioning. Description: Learn and implement personal and interpersonal skills to reduce anxiety and improve interpersonal relationships. Target Date: 2022-01-17 Frequency: Daily Modality: individual Progress: 50%  Related Problem: Learn and implement coping skills that result in a reduction of anxiety and worry, and improved daily functioning. Description: Learn and implement problem-solving strategies for realistically addressing worries. Target Date: 2022-01-17 Frequency: Daily Modality: individual Progress: 50%  Related Problem: Learn and implement coping skills that result in a reduction of anxiety and worry, and improved daily functioning. Description: Describe situations, thoughts, feelings, and actions associated with anxieties and worries, their impact on functioning, and attempts to  resolve them. Target Date: 2022-01-17 Frequency: Daily Modality: individual Progress: 75%  Client Response full compliance  Service Location Location, [address] [place], [place] [address]  Service Code cpt [phone]  Relaxation training  Self-monitoring  Self care activities  Emotion regulation skills  Identify automatic thoughts  Identified an insight  Facilitate problem solving  Normalize/Reframe  Behavioral activation plan  Session notes:  Dx.: Depression and Anxiety  Meds.: Ambien  Goals: Seeking therapy to manage tendency to be over-controlling in life. Is experiencing some symptoms of both anxiety and depression. She also is married to an active alcoholic and needs strategies to manage. Goal date is 6-24. Also expresses a desire to improve physical intimacy with husband. Goal date is 6-24.  Patient agrees to a Nurse, mental health. She is at home and I am at my home office. [name] says that this is performance review time at work. It is stressful for her as she has 11 of them to do. She went to brother's house in [place] for the first time for the Thanksgiving holiday. First time they have ever been invited over. This brother has always been competitive with her. He is older and struggled when she was born. She has always felt his resentment. She sees him at football games and it is "okay" (not hostile). For Christmas it will just be her husband and mother. Feels it is much less stressful without her brother. She states that she is doing better. Her plan of taking off Mondays has been working well. It allows her to focus on health and wellness. She has still been taking hormones and thinks it is helping. Doesn't like the "teenage acne". She feels less erratic on the medication. Her ability to "keep herself in check" is improved. Has not helped libido at all, but thinks it may slowly return. Says she has to get over her aging and the self esteem issue related to aging. That effects  her libido and hopes it will return. Between now and next session, she will focus on better and more frequent communication.                                  Garrel Ridgel, PhD Time: 10:40a-11:30a 50 min

## 2022-01-10 ENCOUNTER — Ambulatory Visit: Payer: 59 | Admitting: Psychology

## 2022-02-07 ENCOUNTER — Ambulatory Visit (INDEPENDENT_AMBULATORY_CARE_PROVIDER_SITE_OTHER): Payer: 59 | Admitting: Psychology

## 2022-02-07 DIAGNOSIS — F331 Major depressive disorder, recurrent, moderate: Secondary | ICD-10-CM

## 2022-02-07 NOTE — Progress Notes (Addendum)
SHADONNA BENEDICK is a 47 y.o. 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 (Status: maintained) -- No Description Entered  Excessive and/or unrealistic worry that is difficult to control occurring more days than not for at least 6 months about a number of events or activities. (Status: maintained) -- No Description Entered  Lack of communication with the partner. (Status: maintained) -- No Description Entered  Medication Status compliance  Safety none  If Suicidal or Homicidal State Action Taken: unspecified  Current Risk: low Medications Ambien (Dosage: unknown)  Objectives Related Problem: Develop healthy interpersonal relationships that lead to the alleviation and help prevent the relapse of depression. Description: Describe current and past experiences with depression including their impact on functioning and attempts to resolve it. Target Date: 2023-01-18 Frequency: Daily Modality: individual Progress: 75%  Related Problem: Develop healthy interpersonal relationships that lead to the alleviation and help prevent the relapse of depression. Description: Verbalize an understanding of healthy and unhealthy emotions with the intent of increasing the use of healthy emotions to guide actions. Target Date: 2023-01-18 Frequency: Daily Modality: individual Progress: 80%  Related Problem: Develop healthy interpersonal relationships that lead to the alleviation and help prevent the relapse of depression. Description: Identify and replace thoughts and beliefs that support depression. Target Date: 2023-01-18 Frequency: Daily Modality:  individual Progress: 70%  Related Problem: Develop the necessary skills for effective, open communication, mutually satisfying sexual intimacy, and enjoyable time for companionship within the relationship. Description: Identify problems and strengths in the relationship, including one's own role in each. Target Date: 2023-01-18 Frequency: Daily Modality: individual Progress: 75%  Related Problem: Develop the necessary skills for effective, open communication, mutually satisfying sexual intimacy, and enjoyable time for companionship within the relationship. Description: Learn and implement problem-solving and conflict resolution skills. Target Date: 2023-01-18 Frequency: Daily Modality: individual Progress: 75%  Related Problem: Learn and implement coping skills that result in a reduction of anxiety and worry, and improved daily functioning. Description: Learn and implement calming skills to reduce overall anxiety and manage anxiety symptoms. Target Date: 2022-01-17 Frequency: Daily Modality: individual Progress: 75%  Related Problem: Learn and implement coping skills that result in a reduction of anxiety and worry, and improved daily functioning. Description: Learn and implement personal and interpersonal skills to reduce anxiety and improve interpersonal relationships. Target Date: 2023-01-18 Frequency: Daily Modality: individual Progress: 80%  Related Problem: Learn and implement coping skills that result in a reduction of anxiety and worry, and improved daily functioning. Description: Learn and implement problem-solving strategies for realistically addressing worries. Target Date: 2023-01-18 Frequency: Daily Modality: individual Progress: 80%  Related Problem: Learn and implement coping skills that result in a reduction of anxiety and worry, and improved daily functioning. Description: Describe situations, thoughts, feelings, and actions associated with anxieties and worries,  their impact on functioning, and attempts to resolve them. Target Date: 2022-01-17 Frequency: Daily  Modality: individual Progress: 100%  Client Response full compliance  Service Location Location, [address] [place], [place] [address]  Service Code cpt [id number]  Relaxation training  Self-monitoring  Self care activities  Emotion regulation skills  Identify automatic thoughts  Identified an insight  Facilitate problem solving  Normalize/Reframe  Behavioral activation plan  Session notes:  Dx.: Depression and Anxiety  Meds.: Ambien  Goals: Seeking therapy to manage tendency to be over-controlling in life. Is experiencing some symptoms of both anxiety and depression. She also is married to an active alcoholic and needs strategies to manage. Goal date is 6-24. Also expresses a desire to improve physical intimacy with husband. Goal date is 6-24.  Patient agrees to a Forensic psychologist. She is at home and I am at my home office. [name] says she is doing well. Last weekend is one of her favorite weeks of the year. Her husband is out of town and she has a whole week by herself to get things done. She likes the break and feels it is healthy. Says she realizes she can do this more frequently during the year. Husband is still drinking heavily and [name] is not making an issue of it. Her husband's brother is in jail for having relations with a minor and will be released in December. She says her husband and his family do not deal with issues.  She feels she is starting to come to terms with the fact that she talks with husband about concerns, things get better, and then go back to where they were. She now feels he will never deal with his drinking and that makes her sad. He is "not self aware" and has no motivation to change. She feels she has to detach how she feels from his drinking. She comes from a family of alcoholics and his drinking is very familiar. She says she has "bad periods" when she  thinks of leaving, but ends up staying for a variety of reasons. We talked about setting up a challenge for a dry month in the relationship. We discussed how to set it up. She will attempt to propose dry Feb for both she and her husband.                                     Marcelina Morel, PhD Time: 8:40a-9:30a 50 min

## 2022-03-14 ENCOUNTER — Ambulatory Visit (INDEPENDENT_AMBULATORY_CARE_PROVIDER_SITE_OTHER): Payer: 59 | Admitting: Psychology

## 2022-03-14 DIAGNOSIS — F331 Major depressive disorder, recurrent, moderate: Secondary | ICD-10-CM | POA: Diagnosis not present

## 2022-03-14 NOTE — Progress Notes (Addendum)
Martha Lewis is a 47 y.o. 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 (Status: maintained) -- No Description Entered  Excessive and/or unrealistic worry that is difficult to control occurring more days than not for at least 6 months about a number of events or activities. (Status: maintained) -- No Description Entered  Lack of communication with the partner. (Status: maintained) -- No Description Entered  Medication Status compliance  Safety none  If Suicidal or Homicidal State Action Taken: unspecified  Current Risk: low Medications Ambien (Dosage: unknown)  Objectives Related Problem: Develop healthy interpersonal relationships that lead to the alleviation and help prevent the relapse of depression. Description: Describe current and past experiences with depression including their impact on functioning and attempts to resolve it. Target Date: 2023-01-18 Frequency: Daily Modality: individual Progress: 75%  Related Problem: Develop healthy interpersonal relationships that lead to the alleviation and help prevent the relapse of depression. Description: Verbalize an understanding of healthy and unhealthy emotions with the intent of increasing the use of healthy emotions to guide actions. Target Date: 2023-01-18 Frequency: Daily Modality: individual Progress: 80%  Related Problem: Develop healthy interpersonal relationships that lead to the alleviation and help prevent the relapse of depression. Description: Identify and replace thoughts and beliefs that support depression. Target Date: 2023-01-18 Frequency: Daily Modality: individual Progress: 70%  Related Problem: Develop the necessary skills for effective, open communication, mutually satisfying sexual  intimacy, and enjoyable time for companionship within the relationship. Description: Identify problems and strengths in the relationship, including one's own role in each. Target Date: 2023-01-18 Frequency: Daily Modality: individual Progress: 75%  Related Problem: Develop the necessary skills for effective, open communication, mutually satisfying sexual intimacy, and enjoyable time for companionship within the relationship. Description: Learn and implement problem-solving and conflict resolution skills. Target Date: 2023-01-18 Frequency: Daily Modality: individual Progress: 75%  Related Problem: Learn and implement coping skills that result in a reduction of anxiety and worry, and improved daily functioning. Description: Learn and implement calming skills to reduce overall anxiety and manage anxiety symptoms. Target Date: 2023-01-18 Frequency: Daily Modality: individual Progress: 75%  Related Problem: Learn and implement coping skills that result in a reduction of anxiety and worry, and improved daily functioning. Description: Learn and implement personal and interpersonal skills to reduce anxiety and improve interpersonal relationships. Target Date: 2023-01-18 Frequency: Daily Modality: individual Progress: 80%  Related Problem: Learn and implement coping skills that result in a reduction of anxiety and worry, and improved daily functioning. Description: Learn and implement problem-solving strategies for realistically addressing worries. Target Date: 2023-01-18 Frequency: Daily Modality: individual Progress: 80%  Related Problem: Learn and implement coping skills that result in a reduction of anxiety and worry, and improved daily functioning. Description: Describe situations, thoughts, feelings, and actions associated with anxieties and worries, their impact on functioning, and attempts to resolve them. Target Date: 2022-01-17 Frequency: Daily Modality: individual Progress:  100%  Client Response full compliance  Service Location Location, [address] [place], [place] [address]  Service Code cpt [phone]  Relaxation training  Self-monitoring  Self care activities  Emotion regulation skills  Identify automatic thoughts  Identified an insight  Facilitate problem solving  Normalize/Reframe  Behavioral activation plan  Session notes:  Dx.: Depression  and Anxiety  Meds.: Ambien  Goals: Seeking therapy to manage tendency to be over-controlling in life. Is experiencing some symptoms of both anxiety and depression. She also is married to an active alcoholic and needs strategies to manage. Goal date is 6-24. Also expresses a desire to improve physical intimacy with husband. Goal date is 6-24.  Patient agrees to a Forensic psychologist. She is at home and I am at my home office. [name] says that she and Thayer Jew did the dry Feb (starting the 4th). She is "pleasantly surprised" that he was so willing to do it. Her mother thinks that Thayer Jew was interested to see if he could do it. The big question is "what happens now?". She has noticed that the abstinence "takes the edge off of the relationship". Her stress level is also improved without alcohol in her life. She has conveyed to Thayer Jew that things are calmer and better without alcohol. Talked with her about how to move forward with this experience. She says "I wish he could be more like I am, but I know he is not". She cannot relate to how he experiences alcohol. She feels she needs to ask herself how she feels if he wants her to quit with him. She is willing to not drink around him, but would like to be able to drink moderately with friends. She says she will have the discussion with Thayer Jew about what she wants and what Thayer Jew wants moving forward with regard to drinking alcohol. Will emphasize her needs/desires as well.                                       Marcelina Morel, PhD Time: 8:40a-9:30a 50 min

## 2022-04-18 ENCOUNTER — Ambulatory Visit (INDEPENDENT_AMBULATORY_CARE_PROVIDER_SITE_OTHER): Payer: 59 | Admitting: Psychology

## 2022-04-18 DIAGNOSIS — F331 Major depressive disorder, recurrent, moderate: Secondary | ICD-10-CM | POA: Diagnosis not present

## 2022-04-18 NOTE — Progress Notes (Signed)
Martha Lewis is a 47 y.o. 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 (Status: maintained) -- No Description Entered  Excessive and/or unrealistic worry that is difficult to control occurring more days than not for at least 6 months about a number of events or activities. (Status: maintained) -- No Description Entered  Lack of communication with the partner. (Status: maintained) -- No Description Entered  Medication Status compliance  Safety none  If Suicidal or Homicidal State Action Taken: unspecified  Current Risk: low Medications Ambien (Dosage: unknown)  Objectives Related Problem: Develop healthy interpersonal relationships that lead to the alleviation and help prevent the relapse of depression. Description: Describe current and past experiences with depression including their impact on functioning and attempts to resolve it. Target Date: 2023-01-18 Frequency: Daily Modality: individual Progress: 75%  Related Problem: Develop healthy interpersonal relationships that lead to the alleviation and help prevent the relapse of depression. Description: Verbalize an understanding of healthy and unhealthy emotions with the intent of increasing the use of healthy emotions to guide actions. Target Date: 2023-01-18 Frequency: Daily Modality: individual Progress: 80%  Related Problem: Develop healthy interpersonal relationships that lead to the alleviation and help prevent the relapse of depression. Description: Identify and replace thoughts and beliefs that support depression. Target Date: 2023-01-18 Frequency: Daily Modality: individual Progress: 70%  Related Problem: Develop the necessary skills for effective, open communication,  mutually satisfying sexual intimacy, and enjoyable time for companionship within the relationship. Description: Identify problems and strengths in the relationship, including one's own role in each. Target Date: 2023-01-18 Frequency: Daily Modality: individual Progress: 75%  Related Problem: Develop the necessary skills for effective, open communication, mutually satisfying sexual intimacy, and enjoyable time for companionship within the relationship. Description: Learn and implement problem-solving and conflict resolution skills. Target Date: 2023-01-18 Frequency: Daily Modality: individual Progress: 75%  Related Problem: Learn and implement coping skills that result in a reduction of anxiety and worry, and improved daily functioning. Description: Learn and implement calming skills to reduce overall anxiety and manage anxiety symptoms. Target Date: 2023-01-18 Frequency: Daily Modality: individual Progress: 75%  Related Problem: Learn and implement coping skills that result in a reduction of anxiety and worry, and improved daily functioning. Description: Learn and implement personal and interpersonal skills to reduce anxiety and improve interpersonal relationships. Target Date: 2023-01-18 Frequency: Daily Modality: individual Progress: 80%  Related Problem: Learn and implement coping skills that result in a reduction of anxiety and worry, and improved daily functioning. Description: Learn and implement problem-solving strategies for realistically addressing worries. Target Date: 2023-01-18 Frequency: Daily Modality: individual Progress: 80%  Related Problem: Learn and implement coping skills that result in a reduction of anxiety and worry, and improved daily functioning. Description: Describe situations, thoughts, feelings, and actions associated with anxieties and worries, their impact on functioning, and attempts to resolve them. Target Date: 2022-01-17 Frequency:  Daily Modality: individual Progress: 100%  Client Response full compliance  Service Location Location, [address] [place], [place] [address]  Service Code cpt [id number]  Relaxation training  Self-monitoring  Self care activities  Emotion regulation skills  Identify automatic thoughts  Identified an insight  Facilitate problem solving  Normalize/Reframe  Behavioral activation plan  Session notes:  Dx.: Depression and Anxiety  Meds.: Ambien  Goals: Seeking therapy to manage tendency to be over-controlling in life. Is experiencing some symptoms of both anxiety and depression. She also is married to an active alcoholic and needs strategies to manage. Goal date is 6-24. Also expresses a desire to improve physical intimacy with husband. Goal date is 6-24.   Patient agrees to a Forensic psychologist. She is at home and I am at my home office.  [name] says her dog got hit by a car last week and she is struggling with dealing with her despair. Not having children,she says this is her focus. She says this was even more traumatic for her husband who was with the dog at the time of the accident.  With regard to the alcohol, her husband said he wanted to see if he could be like [name] and limit his drinking. The "deal" is that when he drinks, he is to limit intake to 1 six-pack. She says it has gone mostly well. She has been surprised that he has been able to do this. A couple of times he will have 8 beers. She feels that it would be satisfying if it can stay at this level. She feels that his sleep patterns are better than before. She is not sure he will be able to sustain this reduced drinking pattern. She is hoping that their relationship will benefit from these changes. Talked about a plan if drinking increases.                                        Marcelina Morel, PhD Time: 8:40a-9:30a 50 min

## 2022-05-08 ENCOUNTER — Ambulatory Visit: Payer: 59 | Admitting: Psychology

## 2022-05-30 ENCOUNTER — Ambulatory Visit (INDEPENDENT_AMBULATORY_CARE_PROVIDER_SITE_OTHER): Payer: 59 | Admitting: Psychology

## 2022-05-30 DIAGNOSIS — F331 Major depressive disorder, recurrent, moderate: Secondary | ICD-10-CM | POA: Diagnosis not present

## 2022-05-30 NOTE — Progress Notes (Signed)
Martha Lewis is a 47 y.o. 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 (Status: maintained) -- No Description Entered  Excessive and/or unrealistic worry that is difficult to control occurring more days than not for at least 6 months about a number of events or activities. (Status: maintained) -- No Description Entered  Lack of communication with the partner. (Status: maintained) -- No Description Entered  Medication Status compliance  Safety none  If Suicidal or Homicidal State Action Taken: unspecified  Current Risk: low Medications Ambien (Dosage: unknown)  Objectives Related Problem: Develop healthy interpersonal relationships that lead to the alleviation and help prevent the relapse of depression. Description: Describe current and past experiences with depression including their impact on functioning and attempts to resolve it. Target Date: 2023-01-18 Frequency: Daily Modality: individual Progress: 75%  Related Problem: Develop healthy interpersonal relationships that lead to the alleviation and help prevent the relapse of depression. Description: Verbalize an understanding of healthy and unhealthy emotions with the intent of increasing the use of healthy emotions to guide actions. Target Date: 2023-01-18 Frequency: Daily Modality: individual Progress: 80%  Related Problem: Develop healthy interpersonal relationships that lead to the alleviation and help prevent the relapse of depression. Description: Identify and replace thoughts and beliefs that support depression. Target Date: 2023-01-18 Frequency: Daily Modality: individual Progress: 70%  Related Problem: Develop the necessary skills for  effective, open communication, mutually satisfying sexual intimacy, and enjoyable time for companionship within the relationship. Description: Identify problems and strengths in the relationship, including one's own role in each. Target Date: 2023-01-18 Frequency: Daily Modality: individual Progress: 75%  Related Problem: Develop the necessary skills for effective, open communication, mutually satisfying sexual intimacy, and enjoyable time for companionship within the relationship. Description: Learn and implement problem-solving and conflict resolution skills. Target Date: 2023-01-18 Frequency: Daily Modality: individual Progress: 75%  Related Problem: Learn and implement coping skills that result in a reduction of anxiety and worry, and improved daily functioning. Description: Learn and implement calming skills to reduce overall anxiety and manage anxiety symptoms. Target Date: 2023-01-18 Frequency: Daily Modality: individual Progress: 75%  Related Problem: Learn and implement coping skills that result in a reduction of anxiety and worry, and improved daily functioning. Description: Learn and implement personal and interpersonal skills to reduce anxiety and improve interpersonal relationships. Target Date: 2023-01-18 Frequency: Daily Modality: individual Progress: 80%  Related Problem: Learn and implement coping skills that result in a reduction of anxiety and worry, and improved daily functioning. Description: Learn and implement problem-solving strategies for realistically addressing worries. Target Date: 2023-01-18 Frequency: Daily Modality: individual Progress: 80%  Related Problem: Learn and implement coping skills that result in a reduction of anxiety and worry, and improved daily functioning. Description: Describe situations, thoughts, feelings, and actions associated with anxieties and worries, their impact on functioning, and attempts to resolve them. Target Date:  2022-01-17 Frequency: Daily Modality: individual Progress: 100%  Client Response full compliance  Service Location Location, [address] [place], [place] [address]  Service Code cpt [phone]  Relaxation training  Self-monitoring  Self  care activities  Emotion regulation skills  Identify automatic thoughts  Identified an insight  Facilitate problem solving  Normalize/Reframe  Behavioral activation plan  Session notes:  Dx.: Depression and Anxiety  Meds.: Ambien  Goals: Seeking therapy to manage tendency to be over-controlling in life. Is experiencing some symptoms of both anxiety and depression. She also is married to an active alcoholic and needs strategies to manage. Goal date is 6-24. Also expresses a desire to improve physical intimacy with husband. Goal date is 6-24.   Patient agrees to a Scientist, physiological. She is at home and I am at my home office.  Martha Lewis says her dog is much better which is a relief for she and her husband. They went to [place] and she was very surprised that her husband was very moderate in his alcohol intake. He was also less social and she was much more social. She says their "whole dynamic was different" and says it was all positive with no "incidents". She is used expecting something that is distressing on a trip. She never thought he could ever do alcohol in moderation, but is now thinking she could be wrong. She is still concerned whether this can last. She wonders if Martha Lewis is responding more positively to having more responsibility at work. This represents a significant positive change. 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 She says in the past she would have been far more agitated and is pleased she was calm and collected. She feels she has grown. Says that she and Martha Lewis are more relaxed around each other. She is hoping to work at taking better care of herself and thinks that will improve her self-esteem.  She is thinking/hoping it will improve the intimacy with Martha Lewis. She feels she needs that to happen and is enthusiastic to address their physical intimacy. She is getting more involved in yoga, which is helping her feel more calm. It has been a good mind body experience.                                          Garrel Ridgel, PhD Time: 8:40a-9:30a 50 min

## 2022-07-04 ENCOUNTER — Ambulatory Visit: Payer: 59 | Admitting: Psychology

## 2022-08-15 ENCOUNTER — Ambulatory Visit (INDEPENDENT_AMBULATORY_CARE_PROVIDER_SITE_OTHER): Payer: 59 | Admitting: Psychology

## 2022-08-15 ENCOUNTER — Other Ambulatory Visit: Payer: Self-pay | Admitting: Radiology

## 2022-08-15 DIAGNOSIS — F411 Generalized anxiety disorder: Secondary | ICD-10-CM

## 2022-08-15 DIAGNOSIS — F330 Major depressive disorder, recurrent, mild: Secondary | ICD-10-CM | POA: Diagnosis not present

## 2022-08-15 DIAGNOSIS — Z1231 Encounter for screening mammogram for malignant neoplasm of breast: Secondary | ICD-10-CM

## 2022-08-15 DIAGNOSIS — F331 Major depressive disorder, recurrent, moderate: Secondary | ICD-10-CM

## 2022-08-15 NOTE — Progress Notes (Signed)
Martha Lewis is a 47 y.o. 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 (Status: maintained) -- No Description Entered  Excessive and/or unrealistic worry that is difficult to control occurring more days than not for at least 6 months about a number of events or activities. (Status: maintained) -- No Description Entered  Lack of communication with the partner. (Status: maintained) -- No Description Entered  Medication Status compliance  Safety none  If Suicidal or Homicidal State Action Taken: unspecified  Current Risk: low Medications Ambien (Dosage: unknown)  Objectives Related Problem: Develop healthy interpersonal relationships that lead to the alleviation and help prevent the relapse of depression. Description: Describe current and past experiences with depression including their impact on functioning and attempts to resolve it. Target Date: 2023-01-18 Frequency: Daily Modality: individual Progress: 75%  Related Problem: Develop healthy interpersonal relationships that lead to the alleviation and help prevent the relapse of depression. Description: Verbalize an understanding of healthy and unhealthy emotions with the intent of increasing the use of healthy emotions to guide actions. Target Date: 2023-01-18 Frequency: Daily Modality: individual Progress: 80%  Related Problem: Develop healthy interpersonal relationships that lead to the alleviation and help prevent the relapse of depression. Description: Identify and replace thoughts and beliefs that support depression. Target Date: 2023-01-18 Frequency: Daily Modality: individual Progress: 70%  Related Problem:  Develop the necessary skills for effective, open communication, mutually satisfying sexual intimacy, and enjoyable time for companionship within the relationship. Description: Identify problems and strengths in the relationship, including one's own role in each. Target Date: 2023-01-18 Frequency: Daily Modality: individual Progress: 75%  Related Problem: Develop the necessary skills for effective, open communication, mutually satisfying sexual intimacy, and enjoyable time for companionship within the relationship. Description: Learn and implement problem-solving and conflict resolution skills. Target Date: 2023-01-18 Frequency: Daily Modality: individual Progress: 75%  Related Problem: Learn and implement coping skills that result in a reduction of anxiety and worry, and improved daily functioning. Description: Learn and implement calming skills to reduce overall anxiety and manage anxiety symptoms. Target Date: 2023-01-18 Frequency: Daily Modality: individual Progress: 75%  Related Problem: Learn and implement coping skills that result in a reduction of anxiety and worry, and improved daily functioning. Description: Learn and implement personal and interpersonal skills to reduce anxiety and improve interpersonal relationships. Target Date: 2023-01-18 Frequency: Daily Modality: individual Progress: 80%  Related Problem: Learn and implement coping skills that result in a reduction of anxiety and worry, and improved daily functioning. Description: Learn and implement problem-solving strategies for realistically addressing worries. Target Date: 2023-01-18 Frequency: Daily Modality: individual Progress: 80%  Related Problem: Learn and implement coping skills that result in a reduction of anxiety and worry, and improved daily functioning. Description: Describe situations, thoughts, feelings, and actions associated with anxieties and worries, their impact on functioning, and attempts to  resolve them. Target Date: 2022-01-17 Frequency: Daily Modality: individual Progress: 100%  Client Response full compliance  Service Location Location, [address]  [place], [place] [address]  Service Code cpt [phone]  Relaxation training  Self-monitoring  Self care activities  Emotion regulation skills  Identify automatic thoughts  Identified an insight  Facilitate problem solving  Normalize/Reframe  Behavioral activation plan  Session notes:  Dx.: Depression and Anxiety  Meds.: Ambien  Goals: Seeking therapy to manage tendency to be over-controlling in life. Is experiencing some symptoms of both anxiety and depression. She also is married to an active alcoholic and needs strategies to manage. Goal date is 6-24. Also expresses a desire to improve physical intimacy with husband. Goal date is 6-24.   Patient agrees to a Scientist, physiological and understands the limitations. She is at home and I am at my office.  [name] says that her husband has done well with limiting his alcohol intake, but on vacation, he drank excessively. Then his brother came to town and he continued the excessive drinking. She has talked to him about going back to limited intake. We talked about how to have a productive discussion with him about the role of alcohol in their lives. She also addressed that their politics are different but they have not let that be a big issue in their lives. She will have the discussion with him and report back.                                          Garrel Ridgel, PhD Time: 8:40a-9:30a 50 min

## 2022-09-12 ENCOUNTER — Telehealth: Payer: Self-pay | Admitting: Genetic Counselor

## 2022-09-12 NOTE — Telephone Encounter (Signed)
Contacted patient to scheduled appointments. Patient is aware of appointments that are scheduled.   

## 2022-09-18 ENCOUNTER — Ambulatory Visit
Admission: RE | Admit: 2022-09-18 | Discharge: 2022-09-18 | Disposition: A | Discharge status: Home / Self Care | Payer: 59 | Source: Ambulatory Visit | Attending: Radiology | Admitting: Radiology

## 2022-09-18 DIAGNOSIS — Z1231 Encounter for screening mammogram for malignant neoplasm of breast: Secondary | ICD-10-CM

## 2022-09-27 ENCOUNTER — Encounter: Payer: Self-pay | Admitting: Radiology

## 2022-09-27 ENCOUNTER — Ambulatory Visit (INDEPENDENT_AMBULATORY_CARE_PROVIDER_SITE_OTHER): Payer: 59 | Admitting: Radiology

## 2022-09-27 VITALS — BP 104/64 | Ht 62.5 in | Wt 131.0 lb

## 2022-09-27 DIAGNOSIS — N951 Menopausal and female climacteric states: Secondary | ICD-10-CM | POA: Diagnosis not present

## 2022-09-27 DIAGNOSIS — Z9189 Other specified personal risk factors, not elsewhere classified: Secondary | ICD-10-CM | POA: Diagnosis not present

## 2022-09-27 DIAGNOSIS — Z01419 Encounter for gynecological examination (general) (routine) without abnormal findings: Secondary | ICD-10-CM

## 2022-09-27 MED ORDER — ESTRADIOL 0.1 MG/24HR TD PTTW: 1.0000 | MEDICATED_PATCH | TRANSDERMAL | 4 refills | Status: DC

## 2022-09-27 MED ORDER — PROGESTERONE MICRONIZED 100 MG PO CAPS: 100.0000 mg | ORAL_CAPSULE | Freq: Every day | ORAL | 4 refills | Status: DC

## 2022-09-27 NOTE — Progress Notes (Signed)
   Martha Lewis 06/20/1975 [id number]   History:  47 y.o. G0 presents for annual exam. Doing well on HRT, would like a refill. Interested in genetic testing, her mother had breast cancer, father had prostate cancer.  Gynecologic History Patient's last menstrual period was 11/03/2015 (lmp unknown).   Contraception/Family planning: post menopausal status Sexually active: yes Last Pap: 2023. Results were: normal Last mammogram: 08/2021. Results were: normal  Obstetric History OB History  Gravida Para Term Preterm AB Living  0 0 0 0 0 0  SAB IAB Ectopic Multiple Live Births  0 0 0 0 0     The following portions of the patient's history were reviewed and updated as appropriate: allergies, current medications, past family history, past medical history, past social history, past surgical history, and problem list.  Review of Systems Pertinent items noted in HPI and remainder of comprehensive ROS otherwise negative.   Past medical history, past surgical history, family history and social history were all reviewed and documented in the EPIC chart.   Exam:  Vitals:   09/27/22 1426  BP: 104/64  Weight: 131 lb (59.4 kg)  Height: 5' 2.5" (1.588 m)   Body mass index is 23.58 kg/m.  General appearance:  Normal Thyroid:  Symmetrical, normal in size, without palpable masses or nodularity. Respiratory  Auscultation:  Clear without wheezing or rhonchi Cardiovascular  Auscultation:  Regular rate, without rubs, murmurs or gallops  Edema/varicosities:  Not grossly evident Abdominal  Soft,nontender, without masses, guarding or rebound.  Liver/spleen:  No organomegaly noted  Hernia:  None appreciated  Skin  Inspection:  Grossly normal Breasts: Examined lying and sitting.   Right: Without masses, retractions, nipple discharge or axillary adenopathy.   Left: Without masses, retractions, nipple discharge or axillary adenopathy. Genitourinary   Inguinal/mons:  Normal without  inguinal adenopathy  External genitalia:  Normal appearing vulva with no masses, tenderness, or lesions  BUS/Urethra/Skene's glands:  Normal without masses or exudate  Vagina:  Normal appearing with normal color and discharge, no lesions  Cervix:  Normal appearing without discharge or lesions  Uterus:  Normal in size, shape and contour.  Mobile, nontender  Adnexa/parametria:     Rt: Normal in size, without masses or tenderness.   Lt: Normal in size, without masses or tenderness.  Anus and perineum: Normal   Raynelle Fanning, CMA present for exam  Assessment/Plan:   1. Well woman exam with routine gynecological exam Pap and screenings up to date Has a PCP  2. Menopausal syndrome - progesterone (PROMETRIUM) 100 MG capsule; Take 1 capsule (100 mg total) by mouth at bedtime.  Dispense: 90 capsule; Refill: 4 - estradiol (VIVELLE-DOT) 0.1 MG/24HR patch; Place 1 patch (0.1 mg total) onto the skin 2 (two) times a week.  Dispense: 24 patch; Refill: 4  3. Increased risk of breast cancer - Ambulatory referral to Genetics     Discussed SBE, colonoscopy and pap screening as directed/appropriate. Recommend of exercise weekly, including weight bearing exercise. Encouraged the use of seatbelts and sunscreen. Return in 1 year for annual or as needed.   Arlie Solomons B WHNP-BC 3:20 PM 09/27/2022

## 2022-10-03 ENCOUNTER — Ambulatory Visit (INDEPENDENT_AMBULATORY_CARE_PROVIDER_SITE_OTHER): Payer: 59 | Admitting: Psychology

## 2022-10-03 DIAGNOSIS — F331 Major depressive disorder, recurrent, moderate: Secondary | ICD-10-CM | POA: Diagnosis not present

## 2022-10-03 DIAGNOSIS — F411 Generalized anxiety disorder: Secondary | ICD-10-CM | POA: Diagnosis not present

## 2022-10-03 NOTE — Progress Notes (Signed)
RUBAB LAUDANO is a 47 y.o. 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 (Status: maintained) -- No Description Entered  Excessive and/or unrealistic worry that is difficult to control occurring more days than not for at least 6 months about a number of events or activities. (Status: maintained) -- No Description Entered  Lack of communication with the partner. (Status: maintained) -- No Description Entered  Medication Status compliance  Safety none  If Suicidal or Homicidal State Action Taken: unspecified  Current Risk: low Medications Ambien (Dosage: unknown)  Objectives Related Problem: Develop healthy interpersonal relationships that lead to the alleviation and help prevent the relapse of depression. Description: Describe current and past experiences with depression including their impact on functioning and attempts to resolve it. Target Date: 2023-01-18 Frequency: Daily Modality: individual Progress: 75%  Related Problem: Develop healthy interpersonal relationships that lead to the alleviation and help prevent the relapse of depression. Description: Verbalize an understanding of healthy and unhealthy emotions with the intent of increasing the use of healthy emotions to guide actions. Target Date: 2023-01-18 Frequency: Daily Modality: individual Progress: 80%  Related Problem: Develop healthy interpersonal relationships that lead to the alleviation and help prevent the relapse of depression. Description: Identify and replace thoughts and beliefs that support depression. Target Date: 2023-01-18 Frequency: Daily Modality: individual Progress:  70%  Related Problem: Develop the necessary skills for effective, open communication, mutually satisfying sexual intimacy, and enjoyable time for companionship within the relationship. Description: Identify problems and strengths in the relationship, including one's own role in each. Target Date: 2023-01-18 Frequency: Daily Modality: individual Progress: 75%  Related Problem: Develop the necessary skills for effective, open communication, mutually satisfying sexual intimacy, and enjoyable time for companionship within the relationship. Description: Learn and implement problem-solving and conflict resolution skills. Target Date: 2023-01-18 Frequency: Daily Modality: individual Progress: 75%  Related Problem: Learn and implement coping skills that result in a reduction of anxiety and worry, and improved daily functioning. Description: Learn and implement calming skills to reduce overall anxiety and manage anxiety symptoms. Target Date: 2023-01-18 Frequency: Daily Modality: individual Progress: 75%  Related Problem: Learn and implement coping skills that result in a reduction of anxiety and worry, and improved daily functioning. Description: Learn and implement personal and interpersonal skills to reduce anxiety and improve interpersonal relationships. Target Date: 2023-01-18 Frequency: Daily Modality: individual Progress: 80%  Related Problem: Learn and implement coping skills that result in a reduction of anxiety and worry, and improved daily functioning. Description: Learn and implement problem-solving strategies for realistically addressing worries. Target Date: 2023-01-18 Frequency: Daily Modality: individual Progress: 80%  Related Problem: Learn and implement coping skills that result in a reduction of anxiety and worry, and improved daily functioning. Description: Describe situations, thoughts, feelings, and actions associated with anxieties and worries, their impact on  functioning, and attempts to resolve them. Target Date: 2022-01-17 Frequency: Daily Modality: individual Progress:  100%  Client Response full compliance  Service Location Location, [address] [place], [place] [address]  Service Code cpt [phone]  Relaxation training  Self-monitoring  Self care activities  Emotion regulation skills  Identify automatic thoughts  Identified an insight  Facilitate problem solving  Normalize/Reframe  Behavioral activation plan  Session notes:  Dx.: Depression and Anxiety  Meds.: Ambien  Goals: Seeking therapy to manage tendency to be over-controlling in life. Is experiencing some symptoms of both anxiety and depression. She also is married to an active alcoholic and needs strategies to manage. Goal date is 6-24. Also expresses a desire to improve physical intimacy with husband. Goal date is 6-24.   Patient agrees to a Scientist, physiological and understands the limitations. She is at home and I am at my office.   [name] has had 2 days of off site meetings, which she finds difficult as an introvert. She is working to respond to husband less impulsively. She tends to be more transactional and wants to change that tendency to avoid the emotional component. She wants to be more "chill". She is taking a lot of yoga, massage and other forms of self care. Told her that she needs to practice expressing how she feels rather than over-reacting. She will make that effort and thinks she is in a good place to initiate.                                           Garrel Ridgel, PhD Time: 8:40a-9:30a 49 min

## 2022-10-04 ENCOUNTER — Other Ambulatory Visit: Payer: 59

## 2022-10-04 ENCOUNTER — Ambulatory Visit: Payer: 59 | Admitting: Radiology

## 2022-10-04 ENCOUNTER — Encounter: Payer: 59 | Admitting: Genetic Counselor

## 2022-11-07 ENCOUNTER — Ambulatory Visit: Payer: 59 | Admitting: Psychology

## 2022-11-07 DIAGNOSIS — F331 Major depressive disorder, recurrent, moderate: Secondary | ICD-10-CM

## 2022-11-07 DIAGNOSIS — F419 Anxiety disorder, unspecified: Secondary | ICD-10-CM

## 2022-11-07 DIAGNOSIS — F32A Depression, unspecified: Secondary | ICD-10-CM

## 2022-11-07 NOTE — Progress Notes (Signed)
Martha Lewis is a 47 y.o. 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 (Status: maintained) -- No Description Entered  Excessive and/or unrealistic worry that is difficult to control occurring more days than not for at least 6 months about a number of events or activities. (Status: maintained) -- No Description Entered  Lack of communication with the partner. (Status: maintained) -- No Description Entered  Medication Status compliance  Safety none  If Suicidal or Homicidal State Action Taken: unspecified  Current Risk: low Medications Ambien (Dosage: unknown)  Objectives Related Problem: Develop healthy interpersonal relationships that lead to the alleviation and help prevent the relapse of depression. Description: Describe current and past experiences with depression including their impact on functioning and attempts to resolve it. Target Date: 2023-01-18 Frequency: Daily Modality: individual Progress: 75%  Related Problem: Develop healthy interpersonal relationships that lead to the alleviation and help prevent the relapse of depression. Description: Verbalize an understanding of healthy and unhealthy emotions with the intent of increasing the use of healthy emotions to guide actions. Target Date: 2023-01-18 Frequency: Daily Modality: individual Progress: 80%  Related Problem: Develop healthy interpersonal relationships that lead to the alleviation and help prevent the relapse of depression. Description: Identify and replace thoughts and beliefs that support depression. Target Date: 2023-01-18 Frequency:  Daily Modality: individual Progress: 70%  Related Problem: Develop the necessary skills for effective, open communication, mutually satisfying sexual intimacy, and enjoyable time for companionship within the relationship. Description: Identify problems and strengths in the relationship, including one's own role in each. Target Date: 2023-01-18 Frequency: Daily Modality: individual Progress: 75%  Related Problem: Develop the necessary skills for effective, open communication, mutually satisfying sexual intimacy, and enjoyable time for companionship within the relationship. Description: Learn and implement problem-solving and conflict resolution skills. Target Date: 2023-01-18 Frequency: Daily Modality: individual Progress: 75%  Related Problem: Learn and implement coping skills that result in a reduction of anxiety and worry, and improved daily functioning. Description: Learn and implement calming skills to reduce overall anxiety and manage anxiety symptoms. Target Date: 2023-01-18 Frequency: Daily Modality: individual Progress: 75%  Related Problem: Learn and implement coping skills that result in a reduction of anxiety and worry, and improved daily functioning. Description: Learn and implement personal and interpersonal skills to reduce anxiety and improve interpersonal relationships. Target Date: 2023-01-18 Frequency: Daily Modality: individual Progress: 80%  Related Problem: Learn and implement coping skills that result in a reduction of anxiety and worry, and improved daily functioning. Description: Learn and implement problem-solving strategies for realistically addressing worries. Target Date: 2023-01-18 Frequency: Daily Modality: individual Progress: 80%  Related Problem: Learn and implement coping skills that result in a reduction of anxiety and worry, and improved daily functioning. Description: Describe situations, thoughts, feelings, and actions associated with  anxieties and worries, their impact  on functioning, and attempts to resolve them. Target Date: 2022-01-17 Frequency: Daily Modality: individual Progress: 100%  Client Response full compliance  Service Location Location, [address] [place], [place] [address]  Service Code cpt [phone]  Relaxation training  Self-monitoring  Self care activities  Emotion regulation skills  Identify automatic thoughts  Identified an insight  Facilitate problem solving  Normalize/Reframe  Behavioral activation plan  Session notes:  Dx.: Depression and Anxiety  Meds.: Ambien  Goals: Seeking therapy to manage tendency to be over-controlling in life. Is experiencing some symptoms of both anxiety and depression. She also is married to an active alcoholic and needs strategies to manage. Goal date is 6-24. Also expresses a desire to improve physical intimacy with husband. Goal date is 6-24.   Patient agrees to a Scientist, physiological and understands the limitations. She is at home and I am at my office.   Martha Lewis says that she she got a call from her deceased friend's boyfriend who told her that her friend wanted her to come to her house to "pick out a memento" to keep. This made Martha Lewis think of her own mortality and what will happen when she passes. It made her think that she needs to "clean house" of a lot of her stuff so her survivors are not burdened. She has now started the process of getting rid of things she does not use. She and Martha Lewis went to [employer] and had a great relaxing weekend. She says that as a couple, they are doing very well with each other.She says Walter's drinking is "getting better but not where it needs to be". She has been using Yoga to calm down. She feels her successful stress management has been the primary factor in helping her achieve her goal weight. She feels she is moving in the "right direction" and is stable.                                               Garrel Ridgel, PhD Time: 8:40a-9:30a 50 min

## 2022-11-23 ENCOUNTER — Other Ambulatory Visit: Payer: Self-pay | Admitting: Medical Genetics

## 2022-11-23 DIAGNOSIS — Z006 Encounter for examination for normal comparison and control in clinical research program: Secondary | ICD-10-CM

## 2022-12-12 ENCOUNTER — Ambulatory Visit: Payer: 59 | Admitting: Psychology

## 2022-12-12 DIAGNOSIS — F411 Generalized anxiety disorder: Secondary | ICD-10-CM

## 2022-12-12 DIAGNOSIS — F331 Major depressive disorder, recurrent, moderate: Secondary | ICD-10-CM | POA: Diagnosis not present

## 2022-12-12 NOTE — Progress Notes (Signed)
Martha Lewis is a 47 y.o. 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 (Status: maintained) -- No Description Entered  Excessive and/or unrealistic worry that is difficult to control occurring more days than not for at least 6 months about a number of events or activities. (Status: maintained) -- No Description Entered  Lack of communication with the partner. (Status: maintained) -- No Description Entered  Medication Status compliance  Safety none  If Suicidal or Homicidal State Action Taken: unspecified  Current Risk: low Medications Ambien (Dosage: unknown)  Objectives Related Problem: Develop healthy interpersonal relationships that lead to the alleviation and help prevent the relapse of depression. Description: Describe current and past experiences with depression including their impact on functioning and attempts to resolve it. Target Date: 2023-01-18 Frequency: Daily Modality: individual Progress: 75%  Related Problem: Develop healthy interpersonal relationships that lead to the alleviation and help prevent the relapse of depression. Description: Verbalize an understanding of healthy and unhealthy emotions with the intent of increasing the use of healthy emotions to guide actions. Target Date: 2023-01-18 Frequency: Daily Modality: individual Progress: 80%  Related Problem: Develop healthy interpersonal relationships that lead to the alleviation and help prevent the relapse of depression. Description: Identify and replace thoughts and beliefs that support depression. Target Date:  2023-01-18 Frequency: Daily Modality: individual Progress: 70%  Related Problem: Develop the necessary skills for effective, open communication, mutually satisfying sexual intimacy, and enjoyable time for companionship within the relationship. Description: Identify problems and strengths in the relationship, including one's own role in each. Target Date: 2023-01-18 Frequency: Daily Modality: individual Progress: 75%  Related Problem: Develop the necessary skills for effective, open communication, mutually satisfying sexual intimacy, and enjoyable time for companionship within the relationship. Description: Learn and implement problem-solving and conflict resolution skills. Target Date: 2023-01-18 Frequency: Daily Modality: individual Progress: 75%  Related Problem: Learn and implement coping skills that result in a reduction of anxiety and worry, and improved daily functioning. Description: Learn and implement calming skills to reduce overall anxiety and manage anxiety symptoms. Target Date: 2023-01-18 Frequency: Daily Modality: individual Progress: 75%  Related Problem: Learn and implement coping skills that result in a reduction of anxiety and worry, and improved daily functioning. Description: Learn and implement personal and interpersonal skills to reduce anxiety and improve interpersonal relationships. Target Date: 2023-01-18 Frequency: Daily Modality: individual Progress: 80%  Related Problem: Learn and implement coping skills that result in a reduction of anxiety and worry, and improved daily functioning. Description: Learn and implement problem-solving strategies for realistically addressing worries. Target Date: 2023-01-18 Frequency: Daily Modality: individual Progress: 80%  Related Problem: Learn and implement coping skills that result in a reduction of anxiety and worry, and improved daily functioning.  Description: Describe situations, thoughts, feelings, and  actions associated with anxieties and worries, their impact on functioning, and attempts to resolve them. Target Date: 2022-01-17 Frequency: Daily Modality: individual Progress: 100%  Client Response full compliance  Service Location Location, [address] [place], [place] [address]  Service Code cpt [phone]  Relaxation training  Self-monitoring  Self care activities  Emotion regulation skills  Identify automatic thoughts  Identified an insight  Facilitate problem solving  Normalize/Reframe  Behavioral activation plan  Session notes:  Dx.: Depression and Anxiety  Meds.: Ambien  Goals: Seeking therapy to manage tendency to be over-controlling in life. Is experiencing some symptoms of both anxiety and depression. She also is married to an active alcoholic and needs strategies to manage. Goal date is 6-24. Also expresses a desire to improve physical intimacy with husband. Goal date is 6-24.   Patient agrees to a Scientist, physiological and understands the limitations. She is at home and I am at my office.   Martha Lewis says that she is "doing well all things considered". She is focusing on relaxation and not just exercise. She will try to get her husband to start some yoga with the hope that it will help his overall sense of well-being. 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 She talked about a friend that left her husband and now wants to reconnect. [name] doesn't really want to re-connect with her. We discussed how to manage the friendship.                                                    Garrel Ridgel, PhD Time: 8:40a-9:30a 50 min

## 2022-12-13 ENCOUNTER — Other Ambulatory Visit (HOSPITAL_COMMUNITY)
Admission: RE | Admit: 2022-12-13 | Discharge: 2022-12-13 | Disposition: A | Discharge status: Home / Self Care | Payer: 59 | Source: Ambulatory Visit | Attending: Oncology | Admitting: Oncology

## 2022-12-13 DIAGNOSIS — Z006 Encounter for examination for normal comparison and control in clinical research program: Secondary | ICD-10-CM | POA: Insufficient documentation

## 2022-12-21 LAB — GENECONNECT MOLECULAR SCREEN: Genetic Analysis Overall Interpretation: NEGATIVE

## 2023-01-02 ENCOUNTER — Ambulatory Visit: Payer: 59 | Admitting: Psychology

## 2023-01-02 DIAGNOSIS — F411 Generalized anxiety disorder: Secondary | ICD-10-CM

## 2023-01-02 DIAGNOSIS — F330 Major depressive disorder, recurrent, mild: Secondary | ICD-10-CM | POA: Diagnosis not present

## 2023-01-02 DIAGNOSIS — F331 Major depressive disorder, recurrent, moderate: Secondary | ICD-10-CM

## 2023-01-02 NOTE — Progress Notes (Signed)
Martha Lewis is a 47 y.o. 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 (Status: maintained) -- No Description Entered  Excessive and/or unrealistic worry that is difficult to control occurring more days than not for at least 6 months about a number of events or activities. (Status: maintained) -- No Description Entered  Lack of communication with the partner. (Status: maintained) -- No Description Entered  Medication Status compliance  Safety none  If Suicidal or Homicidal State Action Taken: unspecified  Current Risk: low Medications Ambien (Dosage: unknown)  Objectives Related Problem: Develop healthy interpersonal relationships that lead to the alleviation and help prevent the relapse of depression. Description: Describe current and past experiences with depression including their impact on functioning and attempts to resolve it. Target Date: 2024-01-18 Frequency: Daily Modality: individual Progress: 85%  Related Problem: Develop healthy interpersonal relationships that lead to the alleviation and help prevent the relapse of depression. Description: Verbalize an understanding of healthy and unhealthy emotions with the intent of increasing the use of healthy emotions to guide actions. Target Date: 2024-01-18 Frequency: Daily Modality: individual Progress: 85%  Related Problem: Develop healthy interpersonal relationships that lead to the alleviation and help prevent the relapse of depression. Description: Identify and replace thoughts and beliefs that  support depression. Target Date: 2024-01-18 Frequency: Daily Modality: individual Progress: 85%  Related Problem: Develop the necessary skills for effective, open communication, mutually satisfying sexual intimacy, and enjoyable time for companionship within the relationship. Description: Identify problems and strengths in the relationship, including one's own role in each. Target Date: 2024-01-18 Frequency: Daily Modality: individual Progress: 80%  Related Problem: Develop the necessary skills for effective, open communication, mutually satisfying sexual intimacy, and enjoyable time for companionship within the relationship. Description: Learn and implement problem-solving and conflict resolution skills. Target Date: 2024-01-18 Frequency: Daily Modality: individual Progress: 85%  Related Problem: Learn and implement coping skills that result in a reduction of anxiety and worry, and improved daily functioning. Description: Learn and implement calming skills to reduce overall anxiety and manage anxiety symptoms. Target Date: 2024-01-18 Frequency: Daily Modality: individual Progress: 85%  Related Problem: Learn and implement coping skills that result in a reduction of anxiety and worry, and improved daily functioning. Description: Learn and implement personal and interpersonal skills to reduce anxiety and improve interpersonal relationships. Target Date: 2024-01-18 Frequency: Daily Modality: individual Progress: 90%  Related Problem: Learn and implement coping skills that result in a reduction of anxiety and worry, and improved daily functioning. Description: Learn and implement problem-solving strategies for realistically addressing worries. Target Date: 2024-01-18 Frequency: Daily Modality: individual Progress: 85%  Related Problem: Learn and implement  coping skills that result in a reduction of anxiety and worry, and improved daily functioning. Description: Describe  situations, thoughts, feelings, and actions associated with anxieties and worries, their impact on functioning, and attempts to resolve them. Target Date: 2022-01-17 Frequency: Daily Modality: individual Progress: 100%  Client Response full compliance  Service Location Location, [address] [place], [place] [address]  Service Code cpt [phone]  Relaxation training  Self-monitoring  Self care activities  Emotion regulation skills  Identify automatic thoughts  Identified an insight  Facilitate problem solving  Normalize/Reframe  Behavioral activation plan  Session notes:  Dx.: Depression and Anxiety  Meds.: Ambien  Goals: Seeking therapy to manage tendency to be over-controlling in life. Is experiencing some symptoms of both anxiety and depression. She also is married to an active alcoholic and needs strategies to manage. Goal date is 6-24. Also expresses a desire to improve physical intimacy with husband. Goal date is 6-25.   Patient agrees to a Scientist, physiological and understands the limitations. She is at home and I am at my office.   [name] and her husband went to [place] with her husband. Says there were a lot of things that came out about family members that she did not know about. There was a lot of dysfunction that was discussed and she says it made her feel "normal". She reports that there was a lot of drinking and it could have been better, but not as bad as it could have been. Her colleague is leaving at end of December. No plans for Christmas. [name] has stated his intention to do a "dry February". She feels that her self care remains a priority and she is "giving herself permission" to be flexible. This is definite progress for her as she tends to remain rigid.                                                        Garrel Ridgel, PhD Time: 8:40a-9:30a 50 min

## 2023-02-13 ENCOUNTER — Ambulatory Visit: Payer: 59 | Admitting: Psychology

## 2023-03-20 ENCOUNTER — Ambulatory Visit: Payer: 59 | Admitting: Psychology

## 2023-03-20 DIAGNOSIS — F32A Depression, unspecified: Secondary | ICD-10-CM

## 2023-03-20 DIAGNOSIS — F411 Generalized anxiety disorder: Secondary | ICD-10-CM

## 2023-03-20 DIAGNOSIS — F331 Major depressive disorder, recurrent, moderate: Secondary | ICD-10-CM

## 2023-03-20 NOTE — Progress Notes (Signed)
 Martha Lewis is a 48 y.o. 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 (Status: maintained) -- No Description Entered  Excessive and/or unrealistic worry that is difficult to control occurring more days than not for at least 6 months about a number of events or activities. (Status: maintained) -- No Description Entered  Lack of communication with the partner. (Status: maintained) -- No Description Entered  Medication Status compliance  Safety none  If Suicidal or Homicidal State Action Taken: unspecified  Current Risk: low Medications Ambien (Dosage: unknown)  Objectives Related Problem: Develop healthy interpersonal relationships that lead to the alleviation and help prevent the relapse of depression. Description: Describe current and past experiences with depression including their impact on functioning and attempts to resolve it. Target Date: 2024-01-18 Frequency: Daily Modality: individual Progress: 85%  Related Problem: Develop healthy interpersonal relationships that lead to the alleviation and help prevent the relapse of depression. Description: Verbalize an understanding of healthy and unhealthy emotions with the intent of increasing the use of healthy emotions to guide actions. Target Date: 2024-01-18 Frequency: Daily Modality: individual Progress: 85%  Related Problem: Develop healthy interpersonal relationships that lead to the alleviation and help prevent the relapse of depression. Description: Identify and replace  thoughts and beliefs that support depression. Target Date: 2024-01-18 Frequency: Daily Modality: individual Progress: 85%  Related Problem: Develop the necessary skills for effective, open communication, mutually satisfying sexual intimacy, and enjoyable time for companionship within the relationship. Description: Identify problems and strengths in the relationship, including one's own role in each. Target Date: 2024-01-18 Frequency: Daily Modality: individual Progress: 80%  Related Problem: Develop the necessary skills for effective, open communication, mutually satisfying sexual intimacy, and enjoyable time for companionship within the relationship. Description: Learn and implement problem-solving and conflict resolution skills. Target Date: 2024-01-18 Frequency: Daily Modality: individual Progress: 85%  Related Problem: Learn and implement coping skills that result in a reduction of anxiety and worry, and improved daily functioning. Description: Learn and implement calming skills to reduce overall anxiety and manage anxiety symptoms. Target Date: 2024-01-18 Frequency: Daily Modality: individual Progress: 85%  Related Problem: Learn and implement coping skills that result in a reduction of anxiety and worry, and improved daily functioning. Description: Learn and implement personal and interpersonal skills to reduce anxiety and improve interpersonal relationships. Target Date: 2024-01-18 Frequency: Daily Modality: individual Progress: 90%  Related Problem: Learn and implement coping skills that result in a reduction of anxiety and worry, and improved daily functioning. Description: Learn and implement problem-solving strategies for realistically addressing worries.  Target Date: 2024-01-18 Frequency: Daily Modality: individual Progress: 85%  Related Problem: Learn and implement coping skills that result in a reduction of anxiety and worry, and improved daily  functioning. Description: Describe situations, thoughts, feelings, and actions associated with anxieties and worries, their impact on functioning, and attempts to resolve them. Target Date: 2022-01-17 Frequency: Daily Modality: individual Progress: 100%  Client Response full compliance  Service Location Location, [address] [place], [place] [address]  Service Code cpt [phone]  Relaxation training  Self-monitoring  Self care activities  Emotion regulation skills  Identify automatic thoughts  Identified an insight  Facilitate problem solving  Normalize/Reframe  Behavioral activation plan  Session notes:  Dx.: Depression and Anxiety  Meds.: Ambien  Goals: Seeking therapy to manage tendency to be over-controlling in life. Is experiencing some symptoms of both anxiety and depression. She also is married to an active alcoholic and needs strategies to manage. Goal date is 12-25. Also expresses a desire to improve physical intimacy with husband. Goal date is 12-25.   Patient agrees to a Scientist, physiological and understands the limitations. She is at home and I am at my office.   Martha Lewis says that things were going well in January so she cancelled the session that month. She says that her counterpart at work accepted the retirement package. The replacement starts Monday and Martha Lewis says the adjustment is going to be difficult, but will ultimately be positive. She has done the no drink in February and says that her sleep patterns are better (especially weekends). Overall physical health feels better. She also says she is less edgy and she is "better at work". Also, their meeting (drinking) place closed and they do not go out Thursday nights. Martha Lewis also did a dry month except for one night with his brothers. They are both surprised that it was easy to not drink for the month. We talked about what she wants the coming months to look like with regard to alcohol consumption. She wants to take  a less controlling posture in these decisions moving forward. She has been very focused on self care. She is feeling much better about her choices. Their relationship is improving and it is more equitable lately. Next session will address marital intimacy.                                                           Garrel Ridgel, PhD Time: 8:40a-9:30a 50 min

## 2023-04-24 ENCOUNTER — Ambulatory Visit (INDEPENDENT_AMBULATORY_CARE_PROVIDER_SITE_OTHER): Payer: 59 | Admitting: Psychology

## 2023-04-24 DIAGNOSIS — F411 Generalized anxiety disorder: Secondary | ICD-10-CM

## 2023-04-24 DIAGNOSIS — F331 Major depressive disorder, recurrent, moderate: Secondary | ICD-10-CM

## 2023-04-24 DIAGNOSIS — F330 Major depressive disorder, recurrent, mild: Secondary | ICD-10-CM

## 2023-04-24 NOTE — Progress Notes (Signed)
 Martha Lewis is a 48 y.o. 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 (Status: maintained) -- No Description Entered  Excessive and/or unrealistic worry that is difficult to control occurring more days than not for at least 6 months about a number of events or activities. (Status: maintained) -- No Description Entered  Lack of communication with the partner. (Status: maintained) -- No Description Entered  Medication Status compliance  Safety none  If Suicidal or Homicidal State Action Taken: unspecified  Current Risk: low Medications Ambien (Dosage: unknown)  Objectives Related Problem: Develop healthy interpersonal relationships that lead to the alleviation and help prevent the relapse of depression. Description: Describe current and past experiences with depression including their impact on functioning and attempts to resolve it. Target Date: 2024-01-18 Frequency: Daily Modality: individual Progress: 85%  Related Problem: Develop healthy interpersonal relationships that lead to the alleviation and help prevent the relapse of depression. Description: Verbalize an understanding of healthy and unhealthy emotions with the intent of increasing the use of healthy emotions to guide actions. Target Date: 2024-01-18 Frequency: Daily Modality: individual Progress: 85%  Related Problem: Develop healthy interpersonal relationships that lead to the alleviation and help prevent the relapse of  depression. Description: Identify and replace thoughts and beliefs that support depression. Target Date: 2024-01-18 Frequency: Daily Modality: individual Progress: 85%  Related Problem: Develop the necessary skills for effective, open communication, mutually satisfying sexual intimacy, and enjoyable time for companionship within the relationship. Description: Identify problems and strengths in the relationship, including one's own role in each. Target Date: 2024-01-18 Frequency: Daily Modality: individual Progress: 80%  Related Problem: Develop the necessary skills for effective, open communication, mutually satisfying sexual intimacy, and enjoyable time for companionship within the relationship. Description: Learn and implement problem-solving and conflict resolution skills. Target Date: 2024-01-18 Frequency: Daily Modality: individual Progress: 85%  Related Problem: Learn and implement coping skills that result in a reduction of anxiety and worry, and improved daily functioning. Description: Learn and implement calming skills to reduce overall anxiety and manage anxiety symptoms. Target Date: 2024-01-18 Frequency: Daily Modality: individual Progress: 85%  Related Problem: Learn and implement coping skills that result in a reduction of anxiety and worry, and improved daily functioning. Description: Learn and implement personal and interpersonal skills to reduce anxiety and improve interpersonal relationships. Target Date: 2024-01-18 Frequency: Daily Modality: individual Progress: 90%  Related Problem: Learn and implement coping skills that result in a reduction of anxiety and worry,  and improved daily functioning. Description: Learn and implement problem-solving strategies for realistically addressing worries. Target Date: 2024-01-18 Frequency: Daily Modality: individual Progress: 85%  Related Problem: Learn and implement coping skills that result in a reduction of anxiety  and worry, and improved daily functioning. Description: Describe situations, thoughts, feelings, and actions associated with anxieties and worries, their impact on functioning, and attempts to resolve them. Target Date: 2022-01-17 Frequency: Daily Modality: individual Progress: 100%  Client Response full compliance  Service Location Location, [address] [place], [place] [address]  Service Code cpt [phone]  Relaxation training  Self-monitoring  Self care activities  Emotion regulation skills  Identify automatic thoughts  Identified an insight  Facilitate problem solving  Normalize/Reframe  Behavioral activation plan  Session notes:  Dx.: Depression and Anxiety  Meds.: Ambien  Goals: Seeking therapy to manage tendency to be over-controlling in life. Is experiencing some symptoms of both anxiety and depression. She also is married to an active alcoholic and needs strategies to manage. Goal date is 12-25. Also expresses a desire to improve physical intimacy with husband. Goal date is 12-25.   Patient agrees to a Scientist, physiological and understands the limitations. She is at home and I am at my office.   [name] says that she and her husband had a dry February and it was a good month for them. She said that she is able to see how alcohol effects her health in negative ways. [name] does not like Publishing rights manager" his alcohol intake. She thinks that after every trip, they should have a period of abstinence. She thinks periodic "level sets" are needed. She is optimistic that both she and husband are on a more positive path. She talked about her husband's lack of sexual drive. He did talk to his doctor about the libido problem. They are going to initiate conversation with each other to address their issues. Have 2 trips planned and hope to use that time.                                                                Garrel Ridgel, PhD Time: 9:40a-10:30a 50 min

## 2023-06-12 ENCOUNTER — Ambulatory Visit (INDEPENDENT_AMBULATORY_CARE_PROVIDER_SITE_OTHER): Admitting: Psychology

## 2023-06-12 DIAGNOSIS — F330 Major depressive disorder, recurrent, mild: Secondary | ICD-10-CM

## 2023-06-12 DIAGNOSIS — F331 Major depressive disorder, recurrent, moderate: Secondary | ICD-10-CM

## 2023-06-12 DIAGNOSIS — F411 Generalized anxiety disorder: Secondary | ICD-10-CM | POA: Diagnosis not present

## 2023-06-12 NOTE — Progress Notes (Signed)
 Martha Lewis is a 48 y.o. 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 (Status: maintained) -- No Description Entered  Excessive and/or unrealistic worry that is difficult to control occurring more days than not for at least 6 months about a number of events or activities. (Status: maintained) -- No Description Entered  Lack of communication with the partner. (Status: maintained) -- No Description Entered  Medication Status compliance  Safety none  If Suicidal or Homicidal State Action Taken: unspecified  Current Risk: low Medications Ambien (Dosage: unknown)  Objectives Related Problem: Develop healthy interpersonal relationships that lead to the alleviation and help prevent the relapse of depression. Description: Describe current and past experiences with depression including their impact on functioning and attempts to resolve it. Target Date: 2024-01-18 Frequency: Daily Modality: individual Progress: 85%  Related Problem: Develop healthy interpersonal relationships that lead to the alleviation and help prevent the relapse of depression. Description: Verbalize an understanding of healthy and unhealthy emotions with the intent of increasing the use of healthy emotions to guide actions. Target Date: 2024-01-18 Frequency: Daily Modality: individual Progress: 85%  Related Problem: Develop healthy interpersonal relationships that lead to the alleviation and help prevent the  relapse of depression. Description: Identify and replace thoughts and beliefs that support depression. Target Date: 2024-01-18 Frequency: Daily Modality: individual Progress: 85%  Related Problem: Develop the necessary skills for effective, open communication, mutually satisfying sexual intimacy, and enjoyable time for companionship within the relationship. Description: Identify problems and strengths in the relationship, including one's own role in each. Target Date: 2024-01-18 Frequency: Daily Modality: individual Progress: 80%  Related Problem: Develop the necessary skills for effective, open communication, mutually satisfying sexual intimacy, and enjoyable time for companionship within the relationship. Description: Learn and implement problem-solving and conflict resolution skills. Target Date: 2024-01-18 Frequency: Daily Modality: individual Progress: 85%  Related Problem: Learn and implement coping skills that result in a reduction of anxiety and worry, and improved daily functioning. Description: Learn and implement calming skills to reduce overall anxiety and manage anxiety symptoms. Target Date: 2024-01-18 Frequency: Daily Modality: individual Progress: 85%  Related Problem: Learn and implement coping skills that result in a reduction of anxiety and worry, and improved daily functioning. Description: Learn and implement personal and interpersonal skills to reduce anxiety and improve interpersonal relationships. Target Date: 2024-01-18 Frequency: Daily Modality: individual Progress: 90%  Related  Problem: Learn and implement coping skills that result in a reduction of anxiety and worry, and improved daily functioning. Description: Learn and implement problem-solving strategies for realistically addressing worries. Target Date: 2024-01-18 Frequency: Daily Modality: individual Progress: 85%  Related Problem: Learn and implement coping skills that result in a reduction  of anxiety and worry, and improved daily functioning. Description: Describe situations, thoughts, feelings, and actions associated with anxieties and worries, their impact on functioning, and attempts to resolve them. Target Date: 2022-01-17 Frequency: Daily Modality: individual Progress: 100%  Client Response full compliance  Service Location Location, [address] [place], [place] [address]  Service Code cpt [phone]  Relaxation training  Self-monitoring  Self care activities  Emotion regulation skills  Identify automatic thoughts  Identified an insight  Facilitate problem solving  Normalize/Reframe  Behavioral activation plan  Session notes:  Dx.: Depression and Anxiety  Meds.: Ambien  Goals: Seeking therapy to manage tendency to be over-controlling in life. Is experiencing some symptoms of both anxiety and depression. She also is married to an active alcoholic and needs strategies to manage. Goal date is 12-25. Also expresses a desire to improve physical intimacy with husband. Goal date is 12-25.   Patient agrees to a Scientist, physiological and understands the limitations. She is at home and I am at my office.   [name] says they have just returned from vacation in [place]. Had a great time and hard time returning. She and [name] tried to be more "intimate" physically. She recognized that she needed more time and to go slower to work her way back to that type of relationship. She says she is in a "guard-protect" mode. She recognizes that she needs to be more complimentary of him because it makes her have more difficulty to feel close to him. She is going to make an effort to not mention the "negatives" and focus  on the positives. We talked about the best way to approach reconnecting physically with each other. We talked about how to initiate dialogue with [name] about their physical relationship. This will be difficult for her because she struggles being vulnerable.                                                                   Jola Nash, PhD Time: 9:40a-10:30a 50 min

## 2023-06-21 ENCOUNTER — Telehealth: Payer: Self-pay | Admitting: *Deleted

## 2023-06-21 NOTE — Telephone Encounter (Signed)
 Patient left message requesting TSH, T3 and T4 labs prior to coming in for her next AEX scheduled for 09/12/23.   Last AEX 09/27/22 Next AEX 09/12/23  Jami -please review and advise on labs

## 2023-08-07 ENCOUNTER — Ambulatory Visit: Admitting: Psychology

## 2023-08-20 ENCOUNTER — Ambulatory Visit: Admitting: Radiology

## 2023-08-27 ENCOUNTER — Encounter: Payer: Self-pay | Admitting: Radiology

## 2023-08-27 ENCOUNTER — Ambulatory Visit (INDEPENDENT_AMBULATORY_CARE_PROVIDER_SITE_OTHER): Admitting: Radiology

## 2023-08-27 VITALS — BP 104/72 | HR 62 | Ht 63.5 in | Wt 132.0 lb

## 2023-08-27 DIAGNOSIS — Z01419 Encounter for gynecological examination (general) (routine) without abnormal findings: Secondary | ICD-10-CM

## 2023-08-27 DIAGNOSIS — E079 Disorder of thyroid, unspecified: Secondary | ICD-10-CM

## 2023-08-27 DIAGNOSIS — R6882 Decreased libido: Secondary | ICD-10-CM | POA: Diagnosis not present

## 2023-08-27 DIAGNOSIS — N951 Menopausal and female climacteric states: Secondary | ICD-10-CM

## 2023-08-27 DIAGNOSIS — Z1331 Encounter for screening for depression: Secondary | ICD-10-CM | POA: Diagnosis not present

## 2023-08-27 MED ORDER — PROGESTERONE MICRONIZED 100 MG PO CAPS: 100.0000 mg | ORAL_CAPSULE | Freq: Every day | ORAL | 4 refills | Status: AC

## 2023-08-27 MED ORDER — ESTRADIOL 0.1 MG/24HR TD PTTW: 1.0000 | MEDICATED_PATCH | TRANSDERMAL | 4 refills | Status: AC

## 2023-08-27 NOTE — Progress Notes (Signed)
 Martha Lewis Aug 26, 1975 [id number]   History:  48 y.o. G0 presents for annual exam. Irregular thyroid  consistently for the past 3 years, no treatment. Would like rechecked today. Weight plateaud despite increased exercise and dietary management. Low libido. Had negative genetic cancer screening 2024.  Gynecologic History Patient's last menstrual period was 11/03/2015 (lmp unknown).   Contraception/Family planning: post menopausal status Sexually active: yes Last Pap: 07/2021. Results were: normal Last mammogram: 09/18/22. Results were: normal Colonoscopy: 02/2022  Obstetric History OB History  Gravida Para Term Preterm AB Living  0 0 0 0 0 0  SAB IAB Ectopic Multiple Live Births  0 0 0 0 0       08/27/2023    4:08 PM  Depression screen PHQ 2/9  Decreased Interest 0  Down, Depressed, Hopeless 0  PHQ - 2 Score 0     The following portions of the patient's history were reviewed and updated as appropriate: allergies, current medications, past family history, past medical history, past social history, past surgical history, and problem list.  Review of Systems  All other systems reviewed and are negative.   Past medical history, past surgical history, family history and social history were all reviewed and documented in the EPIC chart.  Exam:  Vitals:   08/27/23 1555  BP: 104/72  Pulse: 62  SpO2: 98%  Weight: 132 lb (59.9 kg)  Height: 5' 3.5 (1.613 m)   Body mass index is 23.02 kg/m.  Physical Exam Vitals and nursing note reviewed. Exam conducted with a chaperone present.  Constitutional:      Appearance: Normal appearance. She is normal weight.  HENT:     Head: Normocephalic and atraumatic.  Neck:     Thyroid : No thyroid  mass, thyromegaly or thyroid  tenderness.  Cardiovascular:     Rate and Rhythm: Regular rhythm.     Heart sounds: Normal heart sounds.  Pulmonary:     Effort: Pulmonary effort is normal.     Breath sounds: Normal breath sounds.   Chest:  Breasts:    Breasts are symmetrical.     Right: Normal. No inverted nipple, mass, nipple discharge, skin change or tenderness.     Left: Normal. No inverted nipple, mass, nipple discharge, skin change or tenderness.  Abdominal:     General: Abdomen is flat. Bowel sounds are normal.     Palpations: Abdomen is soft.  Genitourinary:    General: Normal vulva.     Vagina: Normal. No vaginal discharge, bleeding or lesions.     Cervix: Normal. No discharge or lesion.     Uterus: Normal. Not enlarged and not tender.      Adnexa: Right adnexa normal and left adnexa normal.       Right: No mass, tenderness or fullness.         Left: No mass, tenderness or fullness.    Lymphadenopathy:     Upper Body:     Right upper body: No axillary adenopathy.     Left upper body: No axillary adenopathy.  Skin:    General: Skin is warm and dry.  Neurological:     Mental Status: She is alert and oriented to person, place, and time.  Psychiatric:        Mood and Affect: Mood normal.        Thought Content: Thought content normal.        Judgment: Judgment normal.      Darice Hoit, CMA present for exam  Assessment/Plan:   1.  Well woman exam with routine gynecological exam (Primary) Pap 2026  2. Menopausal syndrome - estradiol  (VIVELLE -DOT) 0.1 MG/24HR patch; Place 1 patch (0.1 mg total) onto the skin 2 (two) times a week.  Dispense: 24 patch; Refill: 4 - progesterone  (PROMETRIUM ) 100 MG capsule; Take 1 capsule (100 mg total) by mouth at bedtime.  Dispense: 90 capsule; Refill: 4  3. Low libido - Testos,Total,Free and SHBG (Female)  4. Thyroid  dysfunction - Thyroid  Panel With TSH - Anti-TPO Ab (RDL)  5. Depression screening negative      Return in about 1 year (around 08/26/2024) for Annual.  Martha Lewis WHNP-BC 4:35 PM 08/27/2023

## 2023-08-27 NOTE — Patient Instructions (Signed)
 Preventive Care 16-50 Years Old, Female  Preventive care refers to lifestyle choices and visits with your health care provider that can promote health and wellness. Preventive care visits are also called wellness exams.  What can I expect for my preventive care visit?  Counseling  Your health care provider may ask you questions about your:  Medical history, including:  Past medical problems.  Family medical history.  Pregnancy history.  Current health, including:  Menstrual cycle.  Method of birth control.  Emotional well-being.  Home life and relationship well-being.  Sexual activity and sexual health.  Lifestyle, including:  Alcohol, nicotine or tobacco, and drug use.  Access to firearms.  Diet, exercise, and sleep habits.  Work and work Astronomer.  Sunscreen use.  Safety issues such as seatbelt and bike helmet use.  Physical exam  Your health care provider will check your:  Height and weight. These may be used to calculate your BMI (body mass index). BMI is a measurement that tells if you are at a healthy weight.  Waist circumference. This measures the distance around your waistline. This measurement also tells if you are at a healthy weight and may help predict your risk of certain diseases, such as type 2 diabetes and high blood pressure.  Heart rate and blood pressure.  Body temperature.  Skin for abnormal spots.  What immunizations do I need?    Vaccines are usually given at various ages, according to a schedule. Your health care provider will recommend vaccines for you based on your age, medical history, and lifestyle or other factors, such as travel or where you work.  What tests do I need?  Screening  Your health care provider may recommend screening tests for certain conditions. This may include:  Lipid and cholesterol levels.  Diabetes screening. This is done by checking your blood sugar (glucose) after you have not eaten for a while (fasting).  Pelvic exam and Pap test.  Hepatitis B test.  Hepatitis C  test.  HIV (human immunodeficiency virus) test.  STI (sexually transmitted infection) testing, if you are at risk.  Lung cancer screening.  Colorectal cancer screening.  Mammogram. Talk with your health care provider about when you should start having regular mammograms. This may depend on whether you have a family history of breast cancer.  BRCA-related cancer screening. This may be done if you have a family history of breast, ovarian, tubal, or peritoneal cancers.  Bone density scan. This is done to screen for osteoporosis.  Talk with your health care provider about your test results, treatment options, and if necessary, the need for more tests.  Follow these instructions at home:  Eating and drinking    Eat a diet that includes fresh fruits and vegetables, whole grains, lean protein, and low-fat dairy products.  Take vitamin and mineral supplements as recommended by your health care provider.  Do not drink alcohol if:  Your health care provider tells you not to drink.  You are pregnant, may be pregnant, or are planning to become pregnant.  If you drink alcohol:  Limit how much you have to 0-1 drink a day.  Know how much alcohol is in your drink. In the [place] one drink equals one 12 oz bottle of beer (355 mL), one 5 oz glass of wine (148 mL), or one 1 oz glass of hard liquor (44 mL).  Lifestyle  Brush your teeth every morning and night with fluoride toothpaste. Floss one time each day.  Exercise for at least  30 minutes 5 or more days each week.  Do not use any products that contain nicotine or tobacco. These products include cigarettes, chewing tobacco, and vaping devices, such as e-cigarettes. If you need help quitting, ask your health care provider.  Do not use drugs.  If you are sexually active, practice safe sex. Use a condom or other form of protection to prevent STIs.  If you do not wish to become pregnant, use a form of birth control. If you plan to become pregnant, see your health care provider for a  prepregnancy visit.  Take aspirin only as told by your health care provider. Make sure that you understand how much to take and what form to take. Work with your health care provider to find out whether it is safe and beneficial for you to take aspirin daily.  Find healthy ways to manage stress, such as:  Meditation, yoga, or listening to music.  Journaling.  Talking to a trusted person.  Spending time with friends and family.  Minimize exposure to UV radiation to reduce your risk of skin cancer.  Safety  Always wear your seat belt while driving or riding in a vehicle.  Do not drive:  If you have been drinking alcohol. Do not ride with someone who has been drinking.  When you are tired or distracted.  While texting.  If you have been using any mind-altering substances or drugs.  Wear a helmet and other protective equipment during sports activities.  If you have firearms in your house, make sure you follow all gun safety procedures.  Seek help if you have been physically or sexually abused.  What's next?  Visit your health care provider once a year for an annual wellness visit.  Ask your health care provider how often you should have your eyes and teeth checked.  Stay up to date on all vaccines.  This information is not intended to replace advice given to you by your health care provider. Make sure you discuss any questions you have with your health care provider.  Document Revised: 07/06/2020 Document Reviewed: 07/06/2020  Elsevier Patient Education  2024 [employer].

## 2023-08-27 NOTE — Addendum Note (Signed)
 Addended by: Jashira Cotugno on: 08/27/2023 04:39 PM   Modules accepted: Orders

## 2023-08-28 ENCOUNTER — Other Ambulatory Visit: Payer: Self-pay | Admitting: Radiology

## 2023-08-28 ENCOUNTER — Ambulatory Visit: Payer: Self-pay | Admitting: Radiology

## 2023-08-28 DIAGNOSIS — R7989 Other specified abnormal findings of blood chemistry: Secondary | ICD-10-CM

## 2023-08-28 DIAGNOSIS — Z1231 Encounter for screening mammogram for malignant neoplasm of breast: Secondary | ICD-10-CM

## 2023-08-28 LAB — THYROID PEROXIDASE ANTIBODIES (TPO) (REFL): Thyroperoxidase Ab SerPl-aCnc: <1 [IU]/mL (ref <9)

## 2023-08-31 LAB — THYROID PANEL WITH TSH
Free Thyroxine Index: 1.8 (ref 1.4–3.8)
T3 Uptake: 31 % (ref 22–35)
T4, Total: 5.9 ug/dL (ref 5.1–11.9)
TSH: 3.12 m[IU]/L

## 2023-08-31 LAB — TESTOS,TOTAL,FREE AND SHBG (FEMALE)
Free Testosterone: 0.4 pg/mL (ref 0.1–6.4)
Sex Hormone Binding: 72 nmol/L (ref 17–124)
Testosterone, Total, LC-MS-MS: 6 ng/dL (ref 2–45)

## 2023-09-04 MED ORDER — NONFORMULARY OR COMPOUNDED ITEM: 0 refills | Status: AC

## 2023-09-04 NOTE — Telephone Encounter (Signed)
 Compounded testosterone 4% plo gel called in to [facility] per [facility] NP.

## 2023-09-04 NOTE — Telephone Encounter (Signed)
 Please call in Testosterone 4% PLO gel, 30grams to [facility]. Pea sized amount nightly. Recheck level in 12 weeks.

## 2023-09-11 ENCOUNTER — Ambulatory Visit (INDEPENDENT_AMBULATORY_CARE_PROVIDER_SITE_OTHER): Admitting: Psychology

## 2023-09-11 DIAGNOSIS — F330 Major depressive disorder, recurrent, mild: Secondary | ICD-10-CM

## 2023-09-11 DIAGNOSIS — F411 Generalized anxiety disorder: Secondary | ICD-10-CM

## 2023-09-11 DIAGNOSIS — F331 Major depressive disorder, recurrent, moderate: Secondary | ICD-10-CM

## 2023-09-11 NOTE — Progress Notes (Signed)
 Martha Lewis is a 48 y.o. 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 (Status: maintained) -- No Description Entered  Excessive and/or unrealistic worry that is difficult to control occurring more days than not for at least 6 months about a number of events or activities. (Status: maintained) -- No Description Entered  Lack of communication with the partner. (Status: maintained) -- No Description Entered  Medication Status compliance  Safety none  If Suicidal or Homicidal State Action Taken: unspecified  Current Risk: low Medications Ambien (Dosage: unknown)  Objectives Related Problem: Develop healthy interpersonal relationships that lead to the alleviation and help prevent the relapse of depression. Description: Describe current and past experiences with depression including their impact on functioning and attempts to resolve it. Target Date: 2024-01-18 Frequency: Daily Modality: individual Progress: 85%  Related Problem: Develop healthy interpersonal relationships that lead to the alleviation and help prevent the relapse of depression. Description: Verbalize an understanding of healthy and unhealthy emotions with the intent of increasing the use of healthy emotions to guide actions. Target Date: 2024-01-18 Frequency: Daily Modality: individual Progress: 85%  Related Problem: Develop healthy interpersonal relationships that lead to the  alleviation and help prevent the relapse of depression. Description: Identify and replace thoughts and beliefs that support depression. Target Date: 2024-01-18 Frequency: Daily Modality: individual Progress: 85%  Related Problem: Develop the necessary skills for effective, open communication, mutually satisfying sexual intimacy, and enjoyable time for companionship within the relationship. Description: Identify problems and strengths in the relationship, including one's own role in each. Target Date: 2024-01-18 Frequency: Daily Modality: individual Progress: 80%  Related Problem: Develop the necessary skills for effective, open communication, mutually satisfying sexual intimacy, and enjoyable time for companionship within the relationship. Description: Learn and implement problem-solving and conflict resolution skills. Target Date: 2024-01-18 Frequency: Daily Modality: individual Progress: 85%  Related Problem: Learn and implement coping skills that result in a reduction of anxiety and worry, and improved daily functioning. Description: Learn and implement calming skills to reduce overall anxiety and manage anxiety symptoms. Target Date: 2024-01-18 Frequency: Daily Modality: individual Progress: 85%  Related Problem: Learn and implement coping skills that result in a reduction of anxiety and worry, and improved daily functioning. Description: Learn and implement personal and interpersonal skills to reduce anxiety  and improve interpersonal relationships. Target Date: 2024-01-18 Frequency: Daily Modality: individual Progress: 90%  Related Problem: Learn and implement coping skills that result in a reduction of anxiety and worry, and improved daily functioning. Description: Learn and implement problem-solving strategies for realistically addressing worries. Target Date: 2024-01-18 Frequency: Daily Modality: individual Progress: 85%  Related Problem: Learn and implement coping  skills that result in a reduction of anxiety and worry, and improved daily functioning. Description: Describe situations, thoughts, feelings, and actions associated with anxieties and worries, their impact on functioning, and attempts to resolve them. Target Date: 2022-01-17 Frequency: Daily Modality: individual Progress: 100%  Client Response full compliance  Service Location Location, [address] [place], [place] [address]  Service Code cpt [phone]  Relaxation training  Self-monitoring  Self care activities  Emotion regulation skills  Identify automatic thoughts  Identified an insight  Facilitate problem solving  Normalize/Reframe  Behavioral activation plan  Session notes:  Dx.: Depression and Anxiety  Meds.: Ambien  Goals: Seeking therapy to manage tendency to be over-controlling in life. Is experiencing some symptoms of both anxiety and depression. She also is married to an active alcoholic and needs strategies to manage. Goal date is 12-25. Also expresses a desire to improve physical intimacy with husband. Goal date is 12-25.   Patient agrees to a Scientist, physiological and understands the limitations. She is at home and I am at my office.   Lindalee says that things have been chaotic,some of which is out of her control. She has been asked to present at an upcoming meeting, and it creates a lot of stress. This is on heels of her taking a trip to [place]. She went to the beach with her husband and brother to celebrate her 48th birthday. She discussed setting limits and boundaries with brother in law, and felt good about asserting herself. Talked about episode with husband when he drank too much and had a fall. Hit his head and she took him to the emergency room. He was released and was annoyed that [facility] took him to the ER. She had feared he had a concussion. She says that her husband is back to drinking excessively and it is disappointing. His drinking has become the  elephant in the room for them. She does not want to discuss his drinking until after [place] trip because she doesn't want to do anything that will impede their trip. Husband stops by a bar for a drink before coming home from work. Does not happen all the time, but too often. She has concerns about her options with regard to the marriage. The financial cost of a divorce is significant and she isn't sure she can tolerate that fact. We talked about how to structure that talk with Ryan once home.                                                                        CONI ALM KERNS, PhD Time: 8:40a-9:30a 50 min

## 2023-09-12 ENCOUNTER — Ambulatory Visit: Admitting: Radiology

## 2023-10-14 ENCOUNTER — Ambulatory Visit
Admission: RE | Admit: 2023-10-14 | Discharge: 2023-10-14 | Disposition: A | Discharge status: Home / Self Care | Source: Ambulatory Visit | Attending: Radiology | Admitting: Radiology

## 2023-10-14 DIAGNOSIS — Z1231 Encounter for screening mammogram for malignant neoplasm of breast: Secondary | ICD-10-CM

## 2023-10-16 ENCOUNTER — Ambulatory Visit (INDEPENDENT_AMBULATORY_CARE_PROVIDER_SITE_OTHER): Admitting: Psychology

## 2023-10-16 DIAGNOSIS — F330 Major depressive disorder, recurrent, mild: Secondary | ICD-10-CM

## 2023-10-16 DIAGNOSIS — F411 Generalized anxiety disorder: Secondary | ICD-10-CM

## 2023-10-16 DIAGNOSIS — F331 Major depressive disorder, recurrent, moderate: Secondary | ICD-10-CM

## 2023-10-16 NOTE — Progress Notes (Signed)
 Martha Lewis is a 48 y.o. 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 (Status: maintained) -- No Description Entered  Excessive and/or unrealistic worry that is difficult to control occurring more days than not for at least 6 months about a number of events or activities. (Status: maintained) -- No Description Entered  Lack of communication with the partner. (Status: maintained) -- No Description Entered  Medication Status compliance  Safety none  If Suicidal or Homicidal State Action Taken: unspecified  Current Risk: low Medications Ambien (Dosage: unknown)  Objectives Related Problem: Develop healthy interpersonal relationships that lead to the alleviation and help prevent the relapse of depression. Description: Describe current and past experiences with depression including their impact on functioning and attempts to resolve it. Target Date: 2024-01-18 Frequency: Daily Modality: individual Progress: 85%  Related Problem: Develop healthy interpersonal relationships that lead to the alleviation and help prevent the relapse of depression. Description: Verbalize an understanding of healthy and unhealthy emotions with the intent of increasing the use of healthy emotions to guide actions. Target Date: 2024-01-18 Frequency: Daily Modality: individual Progress: 85%  Related Problem: Develop healthy interpersonal  relationships that lead to the alleviation and help prevent the relapse of depression. Description: Identify and replace thoughts and beliefs that support depression. Target Date: 2024-01-18 Frequency: Daily Modality: individual Progress: 85%  Related Problem: Develop the necessary skills for effective, open communication, mutually satisfying sexual intimacy, and enjoyable time for companionship within the relationship. Description: Identify problems and strengths in the relationship, including one's own role in each. Target Date: 2024-01-18 Frequency: Daily Modality: individual Progress: 80%  Related Problem: Develop the necessary skills for effective, open communication, mutually satisfying sexual intimacy, and enjoyable time for companionship within the relationship. Description: Learn and implement problem-solving and conflict resolution skills. Target Date: 2024-01-18 Frequency: Daily Modality: individual Progress: 85%  Related Problem: Learn and implement coping skills that result in a reduction of anxiety and worry, and improved daily functioning. Description: Learn and implement calming skills to reduce overall anxiety and manage anxiety symptoms. Target Date: 2024-01-18 Frequency: Daily Modality: individual Progress: 85%  Related Problem: Learn and implement coping skills that result in a reduction of anxiety and worry,  and improved daily functioning. Description: Learn and implement personal and interpersonal skills to reduce anxiety and improve interpersonal relationships. Target Date: 2024-01-18 Frequency: Daily Modality: individual Progress: 90%  Related Problem: Learn and implement coping skills that result in a reduction of anxiety and worry, and improved daily functioning. Description: Learn and implement problem-solving strategies for realistically addressing worries. Target Date: 2024-01-18 Frequency: Daily Modality: individual Progress: 85%  Related  Problem: Learn and implement coping skills that result in a reduction of anxiety and worry, and improved daily functioning. Description: Describe situations, thoughts, feelings, and actions associated with anxieties and worries, their impact on functioning, and attempts to resolve them. Target Date: 2022-01-17 Frequency: Daily Modality: individual Progress: 100%  Client Response full compliance  Service Location Location, [address] [place], [place] [address]  Service Code cpt [phone]  Relaxation training  Self-monitoring  Self care activities  Emotion regulation skills  Identify automatic thoughts  Identified an insight  Facilitate problem solving  Normalize/Reframe  Behavioral activation plan  Session notes:  Dx.: Depression and Anxiety  Meds.: Ambien  Goals: Seeking therapy to manage tendency to be over-controlling in life. Is experiencing some symptoms of both anxiety and depression. She also is married to an active alcoholic and needs strategies to manage. Goal date is 12-25. Also expresses a desire to improve physical intimacy with husband. Goal date is 12-25.   Patient agrees to a Scientist, physiological and understands the limitations. She is at home and I am at my office.   Martha Lewis says Martha Lewis was amazing and she was pleasantly surprised. She loved the culture and food. She and husband got along very well. She said that Walter's drinking was more manageable. She is now trying to re-acclimate to being back. She says that she and Ryan got closer on trip even though it was not intimate. We discussed how this felt and where she would like things to go. Also, ways to maintain momentum with each other.                                                                             CONI ALM KERNS, PhD Time: 8:40a-9:30a 50 min

## 2023-11-19 ENCOUNTER — Ambulatory Visit: Admitting: Psychology

## 2023-11-19 DIAGNOSIS — F330 Major depressive disorder, recurrent, mild: Secondary | ICD-10-CM

## 2023-11-19 DIAGNOSIS — F411 Generalized anxiety disorder: Secondary | ICD-10-CM | POA: Diagnosis not present

## 2023-11-19 DIAGNOSIS — F331 Major depressive disorder, recurrent, moderate: Secondary | ICD-10-CM

## 2023-11-19 NOTE — Progress Notes (Signed)
 Martha Lewis is a 48 y.o. 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 (Status: maintained) -- No Description Entered  Excessive and/or unrealistic worry that is difficult to control occurring more days than not for at least 6 months about a number of events or activities. (Status: maintained) -- No Description Entered  Lack of communication with the partner. (Status: maintained) -- No Description Entered  Medication Status compliance  Safety none  If Suicidal or Homicidal State Action Taken: unspecified  Current Risk: low Medications Ambien (Dosage: unknown)  Objectives Related Problem: Develop healthy interpersonal relationships that lead to the alleviation and help prevent the relapse of depression. Description: Describe current and past experiences with depression including their impact on functioning and attempts to resolve it. Target Date: 2024-01-18 Frequency: Daily Modality: individual Progress: 85%  Related Problem: Develop healthy interpersonal relationships that lead to the alleviation and help prevent the relapse of depression. Description: Verbalize an understanding of healthy and unhealthy emotions with the intent of increasing the use of healthy emotions to guide actions. Target Date: 2024-01-18 Frequency: Daily Modality: individual Progress: 85%  Related Problem:  Develop healthy interpersonal relationships that lead to the alleviation and help prevent the relapse of depression. Description: Identify and replace thoughts and beliefs that support depression. Target Date: 2024-01-18 Frequency: Daily Modality: individual Progress: 85%  Related Problem: Develop the necessary skills for effective, open communication, mutually satisfying sexual intimacy, and enjoyable time for companionship within the relationship. Description: Identify problems and strengths in the relationship, including one's own role in each. Target Date: 2024-01-18 Frequency: Daily Modality: individual Progress: 80%  Related Problem: Develop the necessary skills for effective, open communication, mutually satisfying sexual intimacy, and enjoyable time for companionship within the relationship. Description: Learn and implement problem-solving and conflict resolution skills. Target Date: 2024-01-18 Frequency: Daily Modality: individual Progress: 85%  Related Problem: Learn and implement coping skills that result in a reduction of anxiety and worry, and improved daily functioning. Description: Learn and implement calming skills to reduce overall anxiety and manage anxiety symptoms. Target Date: 2024-01-18 Frequency: Daily Modality: individual Progress: 85%  Related  Problem: Learn and implement coping skills that result in a reduction of anxiety and worry, and improved daily functioning. Description: Learn and implement personal and interpersonal skills to reduce anxiety and improve interpersonal relationships. Target Date: 2024-01-18 Frequency: Daily Modality: individual Progress: 90%  Related Problem: Learn and implement coping skills that result in a reduction of anxiety and worry, and improved daily functioning. Description: Learn and implement problem-solving strategies for realistically addressing worries. Target Date: 2024-01-18 Frequency: Daily Modality:  individual Progress: 85%  Related Problem: Learn and implement coping skills that result in a reduction of anxiety and worry, and improved daily functioning. Description: Describe situations, thoughts, feelings, and actions associated with anxieties and worries, their impact on functioning, and attempts to resolve them. Target Date: 2022-01-17 Frequency: Daily Modality: individual Progress: 100%  Client Response full compliance  Service Location Location, [address] [place], [place] [address]  Service Code cpt [phone]  Relaxation training  Self-monitoring  Self care activities  Emotion regulation skills  Identify automatic thoughts  Identified an insight  Facilitate problem solving  Normalize/Reframe  Behavioral activation plan  Session notes:  Dx.: Depression and Anxiety  Meds.: Ambien  Goals: Seeking therapy to manage tendency to be over-controlling in life. Is experiencing some symptoms of both anxiety and depression. She also is married to an active alcoholic and needs strategies to manage. Goal date is 12-25. Also expresses a desire to improve physical intimacy with husband. Goal date is 12-25.   Patient agrees to a Scientist, Physiological and understands the limitations. She is at home and I am at my office.   Martha Lewis says that her ob/gyn suggested topical testosterone. Her massage therapist told her that some of her recent discomfort may be coming from that medication. She feels that the benefits do not outweigh the positive effects of the medication. Has stopped all together and states she is feeling better. Martha Lewis says that someone approached her brother that Martha Lewis was drinking during luch during the work week. Her brother told their mother  who told him to call and tell [name]. He waited until she returned from [place]. Her mother proposed that she, her brother and Martha Lewis sit and talk together. Her mother ended up telling her about Martha Lewis. She later found him at a bar at  lunch time on a Friday. She confronted him from the professional angle and the personal (spouse) perspective. She feels his drinking is self-esteem based. Martha Lewis agreed that the optics are not good, but does not acknowledge that his drinking is out of control.                                                                                CONI ALM KERNS, PhD Time: 3:10p-4:00p 50 min

## 2023-12-30 ENCOUNTER — Ambulatory Visit: Admitting: Psychology

## 2024-01-08 ENCOUNTER — Ambulatory Visit: Admitting: Psychology

## 2024-01-08 DIAGNOSIS — F331 Major depressive disorder, recurrent, moderate: Secondary | ICD-10-CM | POA: Diagnosis not present

## 2024-01-08 NOTE — Progress Notes (Signed)
 ELOYCE BULTMAN is a 48 y.o. 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 (Status: maintained) -- No Description Entered  Excessive and/or unrealistic worry that is difficult to control occurring more days than not for at least 6 months about a number of events or activities. (Status: maintained) -- No Description Entered  Lack of communication with the partner. (Status: maintained) -- No Description Entered  Medication Status compliance  Safety none  If Suicidal or Homicidal State Action Taken: unspecified  Current Risk: low Medications Ambien (Dosage: unknown)  Objectives Related Problem: Develop healthy interpersonal relationships that lead to the alleviation and help prevent the relapse of depression. Description: Describe current and past experiences with depression including their impact on functioning and attempts to resolve it. Target Date: 2025-01-17 Frequency: Daily Modality: individual Progress: 90%  Related Problem: Develop healthy interpersonal relationships that lead to the alleviation and help prevent the relapse of depression. Description: Verbalize an understanding of healthy and unhealthy emotions with the intent of increasing the use of healthy emotions to guide actions. Target Date: 2025-01-17 Frequency: Daily Modality:  individual Progress: 90%  Related Problem: Develop healthy interpersonal relationships that lead to the alleviation and help prevent the relapse of depression. Description: Identify and replace thoughts and beliefs that support depression. Target Date: 2025-01-17 Frequency: Daily Modality: individual Progress: 90%  Related Problem: Develop the necessary skills for effective, open communication, mutually satisfying sexual intimacy, and enjoyable time for companionship within the relationship. Description: Identify problems and strengths in the relationship, including one's own role in each. Target Date: 2025-01-17 Frequency: Daily Modality: individual Progress: 85%  Related Problem: Develop the necessary skills for effective, open communication, mutually satisfying sexual intimacy, and enjoyable time for companionship within the relationship. Description: Learn and implement problem-solving and conflict resolution skills. Target Date: 2025-01-17 Frequency: Daily Modality: individual Progress: 85%  Related Problem: Learn and implement coping skills that result in a reduction of anxiety and worry, and improved daily functioning. Description: Learn and implement calming skills to reduce overall anxiety  and manage anxiety symptoms. Target Date: 2025-01-17 Frequency: Daily Modality: individual Progress: 85%  Related Problem: Learn and implement coping skills that result in a reduction of anxiety and worry, and improved daily functioning. Description: Learn and implement personal and interpersonal skills to reduce anxiety and improve interpersonal relationships. Target Date: 2025-01-17 Frequency: Daily Modality: individual Progress: 95%  Related Problem: Learn and implement coping skills that result in a reduction of anxiety and worry, and improved daily functioning. Description: Learn and implement problem-solving strategies for realistically addressing worries. Target Date:  2024-01-18 Frequency: Daily Modality: individual Progress: 85%  Related Problem: Learn and implement coping skills that result in a reduction of anxiety and worry, and improved daily functioning. Description: Describe situations, thoughts, feelings, and actions associated with anxieties and worries, their impact on functioning, and attempts to resolve them. Target Date: 2022-01-17 Frequency: Daily Modality: individual Progress: 100%  Client Response full compliance  Service Location Location, [address] [place], [place] [address]  Service Code cpt [phone]  Relaxation training  Self-monitoring  Self care activities  Emotion regulation skills  Identify automatic thoughts  Identified an insight  Facilitate problem solving  Normalize/Reframe  Behavioral activation plan  Session notes:  Dx.: Depression and Anxiety  Meds.: Ambien  Goals: Seeking therapy to manage tendency to be over-controlling in life. Is experiencing some symptoms of both anxiety and depression. She also is married to an active alcoholic and needs strategies to manage. Goal date is 12-26. Also expresses a desire to improve physical intimacy with husband. Goal date is 12-26.   Patient agrees to a Scientist, Physiological and understands the limitations. She is at home and I am at my office.   Lindalee says her brother got diagnosed with a type of cancer in the neck area. They will remove the tumor. Apparently it is very treatable. Ryan is drinking and she told him that she is tired of alcohol ruling their lives. She isn't sure if Ryan accepts that he has a problem or agrees that he needs to stop drinking. She says he is a wonderful caring person when he is not drinking. He is completely different when he is drinking. She is committed to having a plan for Walter's sobriety by the first of the year.                                                                                    CONI ALM KERNS, PhD  Time: 8:40a-9:30a 50 min

## 2024-02-19 ENCOUNTER — Ambulatory Visit: Payer: Self-pay | Admitting: Psychology

## 2024-02-19 DIAGNOSIS — F331 Major depressive disorder, recurrent, moderate: Secondary | ICD-10-CM | POA: Diagnosis not present

## 2024-02-19 NOTE — Progress Notes (Signed)
 "                                                                                                                                                                                                                                                         Martha Lewis is a 49 y.o. 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 (Status: maintained) -- No Description Entered  Excessive and/or unrealistic worry that is difficult to control occurring more days than not for at least 6 months about a number of events or activities. (Status: maintained) -- No Description Entered  Lack of communication with the partner. (Status: maintained) -- No Description Entered  Medication Status compliance  Safety none  If Suicidal or Homicidal State Action Taken: unspecified  Current Risk: low Medications Ambien (Dosage: unknown)  Objectives Related Problem: Develop healthy interpersonal relationships that lead to the alleviation and help prevent the relapse of depression. Description: Describe current and past experiences with depression including their impact on functioning and attempts to resolve it. Target Date: 2025-01-17 Frequency: Daily Modality: individual Progress: 90%  Related Problem: Develop healthy interpersonal relationships that lead to the alleviation and help prevent the relapse of depression. Description: Verbalize an understanding of healthy and unhealthy emotions with the intent of increasing the use of healthy emotions to guide actions. Target Date: 2025-01-17 Frequency:  Daily Modality: individual Progress: 90%  Related Problem: Develop healthy interpersonal relationships that lead to the alleviation and help prevent the relapse of depression. Description: Identify and replace thoughts and beliefs that support depression. Target Date: 2025-01-17 Frequency: Daily Modality: individual Progress: 90%  Related Problem: Develop the necessary skills for effective, open communication, mutually satisfying sexual intimacy, and enjoyable time for companionship within the relationship. Description: Identify problems and strengths in the relationship, including one's own role in each. Target Date: 2025-01-17 Frequency: Daily Modality: individual Progress: 85%  Related Problem: Develop the necessary skills for effective, open communication, mutually satisfying sexual intimacy, and enjoyable time for companionship within the relationship. Description: Learn and implement problem-solving and conflict resolution skills. Target Date: 2025-01-17 Frequency: Daily Modality: individual Progress: 85%  Related Problem: Learn and implement coping skills that result in a reduction of anxiety  and worry, and improved daily functioning. Description: Learn and implement calming skills to reduce overall anxiety and manage anxiety symptoms. Target Date: 2025-01-17 Frequency: Daily Modality: individual Progress: 85%  Related Problem: Learn and implement coping skills that result in a reduction of anxiety and worry, and improved daily functioning. Description: Learn and implement personal and interpersonal skills to reduce anxiety and improve interpersonal relationships. Target Date: 2025-01-17 Frequency: Daily Modality: individual Progress: 95%  Related Problem: Learn and implement coping skills that result in a reduction of anxiety and worry, and improved daily functioning. Description: Learn and implement problem-solving strategies for realistically addressing  worries. Target Date: 2024-01-18 Frequency: Daily Modality: individual Progress: 85%  Related Problem: Learn and implement coping skills that result in a reduction of anxiety and worry, and improved daily functioning. Description: Describe situations, thoughts, feelings, and actions associated with anxieties and worries, their impact on functioning, and attempts to resolve them. Target Date: 2022-01-17 Frequency: Daily Modality: individual Progress: 100%  Client Response full compliance  Service Location Location, [address] [place], [place] [address]  Service Code cpt [phone]  Relaxation training  Self-monitoring  Self care activities  Emotion regulation skills  Identify automatic thoughts  Identified an insight  Facilitate problem solving  Normalize/Reframe  Behavioral activation plan  Session notes:  Dx.: Depression and Anxiety  Meds.: Ambien  Goals: Seeking therapy to manage tendency to be over-controlling in life. Is experiencing some symptoms of both anxiety and depression. She also is married to an active alcoholic and needs strategies to manage. Goal date is 12-26. Also expresses a desire to improve physical intimacy with husband. Goal date is 12-26.   Patient agrees to a Scientist, Physiological and understands the limitations. She is at home and I am at my office.   Martha Lewis states that it has been difficult to keep her routine given the difficult weather conditions. She likes/needs routine, so this has been hard on her. She says that working out is her stress outlet and it has been tough to not have it available. She says that there is now a Martha Lewis plan in their house. The focus of this plan is for Martha Lewis to be a more active participant in the house and engage in healthier self-care activities. This also entails finding a life that is not centered around alcohol. Discussed the need for Martha Lewis to share with Martha Lewis what is on the line in terms of the changes he  needs to make. She agrees to have this conversation with him.                                                                                       Martha ALM KERNS, PhD Time: 8:40a-9:30a 50 min "

## 2024-03-18 ENCOUNTER — Ambulatory Visit: Admitting: Psychology

## 2024-04-15 ENCOUNTER — Ambulatory Visit: Admitting: Psychology

## 2024-05-20 ENCOUNTER — Ambulatory Visit: Admitting: Psychology

## 2024-07-01 ENCOUNTER — Ambulatory Visit: Admitting: Psychology

## 2024-08-05 ENCOUNTER — Ambulatory Visit: Admitting: Psychology

## 2024-09-09 ENCOUNTER — Ambulatory Visit: Admitting: Psychology
